# Patient Record
Sex: Female | Born: 1970 | Race: Black or African American | Hispanic: No | Marital: Married | Smoking: Never smoker
Health system: Southern US, Community
[De-identification: ages and names within clinical notes are randomized; demographics above are authoritative.]

## PROBLEM LIST (undated history)

## (undated) ENCOUNTER — Ambulatory Visit: Admission: EM

## (undated) DIAGNOSIS — E119 Type 2 diabetes mellitus without complications: Secondary | ICD-10-CM

## (undated) DIAGNOSIS — G43909 Migraine, unspecified, not intractable, without status migrainosus: Secondary | ICD-10-CM

## (undated) DIAGNOSIS — E7800 Pure hypercholesterolemia, unspecified: Secondary | ICD-10-CM

## (undated) HISTORY — PX: TUBAL LIGATION: SHX77

## (undated) HISTORY — PX: BREAST EXCISIONAL BIOPSY: SUR124

---

## 1998-09-16 ENCOUNTER — Other Ambulatory Visit: Admission: RE | Admit: 1998-09-16 | Discharge: 1998-09-16 | Payer: Self-pay | Admitting: *Deleted

## 1998-10-25 ENCOUNTER — Ambulatory Visit (HOSPITAL_COMMUNITY): Admission: RE | Admit: 1998-10-25 | Discharge: 1998-10-25 | Payer: Self-pay | Admitting: Surgery

## 1998-10-25 ENCOUNTER — Encounter (INDEPENDENT_AMBULATORY_CARE_PROVIDER_SITE_OTHER): Payer: Self-pay | Admitting: Specialist

## 2000-01-21 ENCOUNTER — Other Ambulatory Visit: Admission: RE | Admit: 2000-01-21 | Discharge: 2000-01-21 | Payer: Self-pay | Admitting: *Deleted

## 2000-09-16 ENCOUNTER — Other Ambulatory Visit: Admission: RE | Admit: 2000-09-16 | Discharge: 2000-09-16 | Payer: Self-pay | Admitting: *Deleted

## 2002-03-27 ENCOUNTER — Other Ambulatory Visit: Admission: RE | Admit: 2002-03-27 | Discharge: 2002-03-27 | Payer: Self-pay | Admitting: *Deleted

## 2002-05-11 HISTORY — PX: BREAST BIOPSY: SHX20

## 2003-09-17 ENCOUNTER — Other Ambulatory Visit: Admission: RE | Admit: 2003-09-17 | Discharge: 2003-09-17 | Payer: Self-pay | Admitting: Family Medicine

## 2004-12-16 ENCOUNTER — Other Ambulatory Visit: Admission: RE | Admit: 2004-12-16 | Discharge: 2004-12-16 | Payer: Self-pay | Admitting: Family Medicine

## 2004-12-22 ENCOUNTER — Encounter: Admission: RE | Admit: 2004-12-22 | Discharge: 2004-12-22 | Payer: Self-pay | Admitting: Family Medicine

## 2005-07-23 ENCOUNTER — Encounter: Admission: RE | Admit: 2005-07-23 | Discharge: 2005-07-23 | Payer: Self-pay | Admitting: Family Medicine

## 2005-12-30 ENCOUNTER — Other Ambulatory Visit: Admission: RE | Admit: 2005-12-30 | Discharge: 2005-12-30 | Payer: Self-pay | Admitting: Family Medicine

## 2006-06-10 ENCOUNTER — Encounter: Admission: RE | Admit: 2006-06-10 | Discharge: 2006-06-10 | Payer: Self-pay | Admitting: Allergy and Immunology

## 2006-09-28 ENCOUNTER — Other Ambulatory Visit: Admission: RE | Admit: 2006-09-28 | Discharge: 2006-09-28 | Payer: Self-pay | Admitting: Obstetrics and Gynecology

## 2008-04-11 ENCOUNTER — Other Ambulatory Visit: Admission: RE | Admit: 2008-04-11 | Discharge: 2008-04-11 | Payer: Self-pay | Admitting: Family Medicine

## 2008-05-14 ENCOUNTER — Encounter: Admission: RE | Admit: 2008-05-14 | Discharge: 2008-05-14 | Payer: Self-pay | Admitting: Family Medicine

## 2009-04-17 ENCOUNTER — Other Ambulatory Visit: Admission: RE | Admit: 2009-04-17 | Discharge: 2009-04-17 | Payer: Self-pay | Admitting: Family Medicine

## 2009-04-22 ENCOUNTER — Encounter: Admission: RE | Admit: 2009-04-22 | Discharge: 2009-04-22 | Payer: Self-pay | Admitting: Family Medicine

## 2011-10-22 ENCOUNTER — Other Ambulatory Visit: Payer: Self-pay | Admitting: Family Medicine

## 2011-10-22 ENCOUNTER — Other Ambulatory Visit: Payer: Self-pay | Admitting: Obstetrics and Gynecology

## 2011-10-22 DIAGNOSIS — Z1231 Encounter for screening mammogram for malignant neoplasm of breast: Secondary | ICD-10-CM

## 2011-10-26 ENCOUNTER — Ambulatory Visit: Payer: Self-pay

## 2011-10-29 ENCOUNTER — Ambulatory Visit
Admission: RE | Admit: 2011-10-29 | Discharge: 2011-10-29 | Disposition: A | Discharge status: Home / Self Care | Payer: Federal, State, Local not specified - PPO | Source: Ambulatory Visit | Attending: Obstetrics and Gynecology | Admitting: Obstetrics and Gynecology

## 2011-10-29 DIAGNOSIS — Z1231 Encounter for screening mammogram for malignant neoplasm of breast: Secondary | ICD-10-CM

## 2011-11-16 ENCOUNTER — Other Ambulatory Visit: Payer: Self-pay | Admitting: Obstetrics and Gynecology

## 2011-11-16 ENCOUNTER — Other Ambulatory Visit (HOSPITAL_COMMUNITY)
Admission: RE | Admit: 2011-11-16 | Discharge: 2011-11-16 | Disposition: A | Discharge status: Home / Self Care | Payer: Federal, State, Local not specified - PPO | Source: Ambulatory Visit | Attending: Obstetrics and Gynecology | Admitting: Obstetrics and Gynecology

## 2011-11-16 DIAGNOSIS — Z01419 Encounter for gynecological examination (general) (routine) without abnormal findings: Secondary | ICD-10-CM | POA: Insufficient documentation

## 2012-07-03 ENCOUNTER — Encounter (HOSPITAL_COMMUNITY): Payer: Self-pay | Admitting: Emergency Medicine

## 2012-07-03 ENCOUNTER — Emergency Department (HOSPITAL_COMMUNITY)
Admission: EM | Admit: 2012-07-03 | Discharge: 2012-07-03 | Disposition: A | Discharge status: Home / Self Care | Payer: Federal, State, Local not specified - PPO | Attending: Emergency Medicine | Admitting: Emergency Medicine

## 2012-07-03 ENCOUNTER — Emergency Department (HOSPITAL_COMMUNITY): Payer: Federal, State, Local not specified - PPO

## 2012-07-03 DIAGNOSIS — R109 Unspecified abdominal pain: Secondary | ICD-10-CM

## 2012-07-03 DIAGNOSIS — Z79899 Other long term (current) drug therapy: Secondary | ICD-10-CM | POA: Insufficient documentation

## 2012-07-03 DIAGNOSIS — R112 Nausea with vomiting, unspecified: Secondary | ICD-10-CM | POA: Insufficient documentation

## 2012-07-03 DIAGNOSIS — Z9851 Tubal ligation status: Secondary | ICD-10-CM | POA: Insufficient documentation

## 2012-07-03 DIAGNOSIS — E119 Type 2 diabetes mellitus without complications: Secondary | ICD-10-CM | POA: Insufficient documentation

## 2012-07-03 DIAGNOSIS — N898 Other specified noninflammatory disorders of vagina: Secondary | ICD-10-CM

## 2012-07-03 DIAGNOSIS — Z3202 Encounter for pregnancy test, result negative: Secondary | ICD-10-CM | POA: Insufficient documentation

## 2012-07-03 HISTORY — DX: Type 2 diabetes mellitus without complications: E11.9

## 2012-07-03 LAB — POCT I-STAT, CHEM 8
BUN: 12 mg/dL (ref 6–23)
Calcium, Ion: 1.13 mmol/L (ref 1.12–1.23)
Chloride: 105 mEq/L (ref 96–112)
Creatinine, Ser: 0.6 mg/dL (ref 0.50–1.10)
Glucose, Bld: 159 mg/dL — ABNORMAL HIGH (ref 70–99)
HCT: 35 % — ABNORMAL LOW (ref 36.0–46.0)
Hemoglobin: 11.9 g/dL — ABNORMAL LOW (ref 12.0–15.0)
Potassium: 3.2 mEq/L — ABNORMAL LOW (ref 3.5–5.1)
Sodium: 142 mEq/L (ref 135–145)
TCO2: 25 mmol/L (ref 0–100)

## 2012-07-03 LAB — CBC WITH DIFFERENTIAL/PLATELET
Basophils Absolute: 0 10*3/uL (ref 0.0–0.1)
Basophils Relative: 0 % (ref 0–1)
Eosinophils Absolute: 0.2 10*3/uL (ref 0.0–0.7)
Eosinophils Relative: 3 % (ref 0–5)
HCT: 32.4 % — ABNORMAL LOW (ref 36.0–46.0)
Hemoglobin: 10.2 g/dL — ABNORMAL LOW (ref 12.0–15.0)
Lymphocytes Relative: 37 % (ref 12–46)
Lymphs Abs: 2.8 10*3/uL (ref 0.7–4.0)
MCH: 22.8 pg — ABNORMAL LOW (ref 26.0–34.0)
MCHC: 31.5 g/dL (ref 30.0–36.0)
MCV: 72.3 fL — ABNORMAL LOW (ref 78.0–100.0)
Monocytes Absolute: 0.4 10*3/uL (ref 0.1–1.0)
Monocytes Relative: 6 % (ref 3–12)
Neutro Abs: 4.2 10*3/uL (ref 1.7–7.7)
Neutrophils Relative %: 55 % (ref 43–77)
Platelets: 246 10*3/uL (ref 150–400)
RBC: 4.48 MIL/uL (ref 3.87–5.11)
RDW: 17.5 % — ABNORMAL HIGH (ref 11.5–15.5)
WBC: 7.7 10*3/uL (ref 4.0–10.5)

## 2012-07-03 LAB — URINALYSIS, ROUTINE W REFLEX MICROSCOPIC
Bilirubin Urine: NEGATIVE
Glucose, UA: NEGATIVE mg/dL
Ketones, ur: NEGATIVE mg/dL
Leukocytes, UA: NEGATIVE
Nitrite: NEGATIVE
Protein, ur: NEGATIVE mg/dL
Specific Gravity, Urine: 1.024 (ref 1.005–1.030)
Urobilinogen, UA: 1 mg/dL (ref 0.0–1.0)
pH: 6.5 (ref 5.0–8.0)

## 2012-07-03 LAB — POCT PREGNANCY, URINE: Preg Test, Ur: NEGATIVE

## 2012-07-03 LAB — BASIC METABOLIC PANEL WITH GFR
BUN: 12 mg/dL (ref 6–23)
CO2: 25 meq/L (ref 19–32)
Calcium: 8.8 mg/dL (ref 8.4–10.5)
Chloride: 101 meq/L (ref 96–112)
Creatinine, Ser: 0.63 mg/dL (ref 0.50–1.10)
GFR calc Af Amer: >90 mL/min (ref >90)
GFR calc non Af Amer: >90 mL/min (ref >90)
Glucose, Bld: 156 mg/dL — ABNORMAL HIGH (ref 70–99)
Potassium: 3.2 meq/L — ABNORMAL LOW (ref 3.5–5.1)
Sodium: 137 meq/L (ref 135–145)

## 2012-07-03 LAB — WET PREP, GENITAL
Clue Cells Wet Prep HPF POC: NONE SEEN
Trich, Wet Prep: NONE SEEN
WBC, Wet Prep HPF POC: NONE SEEN
Yeast Wet Prep HPF POC: NONE SEEN

## 2012-07-03 LAB — URINE MICROSCOPIC-ADD ON

## 2012-07-03 MED ORDER — OXYCODONE-ACETAMINOPHEN 5-325 MG PO TABS: 1.0000 | ORAL_TABLET | ORAL | Status: DC | PRN

## 2012-07-03 MED ORDER — ONDANSETRON HCL 4 MG PO TABS: 4.0000 mg | ORAL_TABLET | Freq: Four times a day (QID) | ORAL | Status: DC

## 2012-07-03 MED ORDER — ONDANSETRON 4 MG PO TBDP
4.0000 mg | ORAL_TABLET | Freq: Once | ORAL | Status: AC
  Administered 2012-07-03: 4 mg via ORAL
  Filled 2012-07-03: qty 1

## 2012-07-03 MED ORDER — OXYCODONE-ACETAMINOPHEN 5-325 MG PO TABS
1.0000 | ORAL_TABLET | Freq: Once | ORAL | Status: AC
  Administered 2012-07-03: 1 via ORAL
  Filled 2012-07-03: qty 1

## 2012-07-03 MED ORDER — POTASSIUM CHLORIDE CRYS ER 20 MEQ PO TBCR
40.0000 meq | EXTENDED_RELEASE_TABLET | Freq: Once | ORAL | Status: AC
  Administered 2012-07-03: 40 meq via ORAL
  Filled 2012-07-03: qty 2

## 2012-07-03 NOTE — ED Notes (Signed)
Pt states that she had lower abdominal pain that started about 7pm this evening; pt describes as it felt like she was having a baby; pt states originally the pain started in her back and then came down to her lower abdomen; pt denies pain upon urination; pt states she is currently mensurating and has noticed brown vaginal d/c starting yesterday. Pt alert and mentating appropriately. Pt does not show signs of acute distress currently.

## 2012-07-03 NOTE — ED Notes (Signed)
Patient with severe abdominal pain, started 20 minutes ago while she was a church, constant pain.  Patient with nausea and vomiting, x1.

## 2012-07-03 NOTE — ED Notes (Addendum)
Pt ambulatory leaving ED; Pt given d/c teaching and prescriptions; Pt given follow up care instructions; pt verbalizes understanding of d/c teaching and has no further questions upon d/c; pt alert and mentating appropriately upon d/c; pt does not show signs of acute distress upon d/c. Pt instructed not to drive; pts husband endorses pt will not be driving home.

## 2012-07-03 NOTE — ED Provider Notes (Signed)
History     CSN: [id number]  Arrival date & time 07/03/12  1941   First MD Initiated Contact with Patient 07/03/12 2035      Chief Complaint  Patient presents with  . Abdominal Pain    (Consider location/radiation/quality/duration/timing/severity/associated sxs/prior treatment) Patient is a 42 y.o. female presenting with abdominal pain. The history is provided by the patient and the spouse. No language interpreter was used.  Abdominal Pain Pain location:  L flank Pain quality: sharp   Pain radiates to:  LUQ Pain severity:  Moderate Onset quality:  Sudden Duration:  1 hour Timing:  Constant Progression:  Resolved Chronicity:  New Context: not previous surgeries and not suspicious food intake   Relieved by:  Nothing Worsened by:  Movement Ineffective treatments:  Acetaminophen Associated symptoms: nausea, vaginal bleeding, vaginal discharge and vomiting   Associated symptoms: no chest pain, no chills, no constipation, no diarrhea, no dysuria, no fever, no hematuria and no shortness of breath    42 year old female with acute onset of left flank pain radiating to the left upper quadrant when she was at [employer]. The pain was sharp 10 out of 10 and constant x30 minutes. Pain has resolved presently, although she is sore in her left flank presently. Moving made the pain worse. Nothing made the pain better. She took a Tylenol but soon vomited it back up. Also states she has had vaginal discharge x2 days. She started her period today. Last bowel movement was Friday. Past medical history of diabetes and she's had her tubes tied. PCP is Dr. Lupe Carney. Denies chest pain, hematuria, fever.  Past Medical History  Diagnosis Date  . Diabetes mellitus without complication     Past Surgical History  Procedure Laterality Date  . Tubal ligation      No family history on file.  History  Substance Use Topics  . Smoking status: Never Smoker   . Smokeless tobacco: Not on file  .  Alcohol Use: Yes     Comment: socially    OB History   Grav Para Term Preterm Abortions TAB SAB Ect Mult Living                  Review of Systems  Constitutional: Negative.  Negative for fever, chills and diaphoresis.  HENT: Negative.   Eyes: Negative.   Respiratory: Negative.  Negative for shortness of breath.   Cardiovascular: Negative.  Negative for chest pain.  Gastrointestinal: Positive for nausea, vomiting and abdominal pain. Negative for diarrhea, constipation and blood in stool.  Genitourinary: Positive for flank pain, vaginal bleeding and vaginal discharge. Negative for dysuria, frequency, hematuria and difficulty urinating.  Neurological: Negative.  Negative for dizziness and light-headedness.  Psychiatric/Behavioral: Negative.   All other systems reviewed and are negative.    Allergies  Review of patient's allergies indicates no known allergies.  Home Medications  No current outpatient prescriptions on file.  BP 128/82  Pulse 85  Temp(Src) 98.5 F (36.9 C) (Oral)  Resp 16  SpO2 97%  LMP 07/03/2012  Physical Exam  Nursing note and vitals reviewed. Constitutional: She is oriented to person, place, and time. She appears well-developed and well-nourished.  HENT:  Head: Normocephalic and atraumatic.  Eyes: Conjunctivae and EOM are normal. Pupils are equal, round, and reactive to light.  Neck: Normal range of motion. Neck supple.  Cardiovascular: Normal rate.   Pulmonary/Chest: Effort normal and breath sounds normal. No respiratory distress.  Abdominal: Soft. She exhibits distension. There is tenderness. There  is no rebound and no guarding.  Left flank tenderness  Musculoskeletal: Normal range of motion. She exhibits no edema and no tenderness.  Neurological: She is alert and oriented to person, place, and time. She has normal reflexes.  Skin: Skin is warm and dry.  Psychiatric: She has a normal mood and affect.    ED Course  Pelvic exam Date/Time:  07/03/2012 10:59 PM Performed by: Remi Haggard Authorized by: Remi Haggard Consent: Verbal consent obtained. Risks and benefits: risks, benefits and alternatives were discussed Consent given by: patient Patient understanding: patient states understanding of the procedure being performed Patient identity confirmed: verbally with patient, arm band, provided demographic data and hospital-assigned identification number Time out: Immediately prior to procedure a "time out" was called to verify the correct patient, procedure, equipment, support staff and site/side marked as required. Preparation: Patient was prepped and draped in the usual sterile fashion. Local anesthesia used: no Patient sedated: no Patient tolerance: Patient tolerated the procedure well with no immediate complications. Comments: Vaginal bleeding   (including critical care time)  Labs Reviewed  CBC WITH DIFFERENTIAL - Abnormal; Notable for the following:    Hemoglobin 10.2 (*)    HCT 32.4 (*)    MCV 72.3 (*)    MCH 22.8 (*)    RDW 17.5 (*)    All other components within normal limits  BASIC METABOLIC PANEL - Abnormal; Notable for the following:    Potassium 3.2 (*)    Glucose, Bld 156 (*)    All other components within normal limits  URINALYSIS, ROUTINE W REFLEX MICROSCOPIC - Abnormal; Notable for the following:    Hgb urine dipstick LARGE (*)    All other components within normal limits  URINE MICROSCOPIC-ADD ON - Abnormal; Notable for the following:    Squamous Epithelial / LPF FEW (*)    All other components within normal limits  POCT I-STAT, CHEM 8 - Abnormal; Notable for the following:    Potassium 3.2 (*)    Glucose, Bld 159 (*)    Hemoglobin 11.9 (*)    HCT 35.0 (*)    All other components within normal limits  WET PREP, GENITAL  GC/CHLAMYDIA PROBE AMP  POCT PREGNANCY, URINE   No results found.   No diagnosis found.    MDM  L flank/LLQ pain subsided in the ER.  CT abdomen shows no kidney  stone. Possible mesenteritis. Hypokalemia with potassium replacement in the ER.  All other labs unremarkable.  Pelvic exam with vaginal bleeding, other wise normal.  She will follow up with her primary care provider this week or return for worsening symptoms.  rx for zofran and percocet.  She is ready for discharge.  Labs Reviewed  CBC WITH DIFFERENTIAL - Abnormal; Notable for the following:    Hemoglobin 10.2 (*)    HCT 32.4 (*)    MCV 72.3 (*)    MCH 22.8 (*)    RDW 17.5 (*)    All other components within normal limits  BASIC METABOLIC PANEL - Abnormal; Notable for the following:    Potassium 3.2 (*)    Glucose, Bld 156 (*)    All other components within normal limits  URINALYSIS, ROUTINE W REFLEX MICROSCOPIC - Abnormal; Notable for the following:    Hgb urine dipstick LARGE (*)    All other components within normal limits  URINE MICROSCOPIC-ADD ON - Abnormal; Notable for the following:    Squamous Epithelial / LPF FEW (*)    All other components within  normal limits  POCT I-STAT, CHEM 8 - Abnormal; Notable for the following:    Potassium 3.2 (*)    Glucose, Bld 159 (*)    Hemoglobin 11.9 (*)    HCT 35.0 (*)    All other components within normal limits  WET PREP, GENITAL  GC/CHLAMYDIA PROBE AMP  POCT PREGNANCY, URINE          Remi Haggard, NP 07/04/12 1600

## 2012-07-04 LAB — GC/CHLAMYDIA PROBE AMP
CT Probe RNA: NEGATIVE
GC Probe RNA: NEGATIVE

## 2012-07-05 NOTE — ED Provider Notes (Signed)
Medical screening examination/treatment/procedure(s) were performed by non-physician practitioner and as supervising physician I was immediately available for consultation/collaboration.  Panagiotis Oelkers, MD 07/05/12 0013 

## 2012-09-21 ENCOUNTER — Other Ambulatory Visit: Payer: Self-pay

## 2012-09-21 DIAGNOSIS — Z1231 Encounter for screening mammogram for malignant neoplasm of breast: Secondary | ICD-10-CM

## 2012-09-30 ENCOUNTER — Ambulatory Visit
Admission: RE | Admit: 2012-09-30 | Discharge: 2012-09-30 | Disposition: A | Discharge status: Home / Self Care | Payer: Federal, State, Local not specified - PPO | Source: Ambulatory Visit

## 2012-09-30 DIAGNOSIS — Z1231 Encounter for screening mammogram for malignant neoplasm of breast: Secondary | ICD-10-CM

## 2012-11-22 ENCOUNTER — Other Ambulatory Visit (HOSPITAL_COMMUNITY)
Admission: RE | Admit: 2012-11-22 | Discharge: 2012-11-22 | Disposition: A | Discharge status: Home / Self Care | Payer: Federal, State, Local not specified - PPO | Source: Ambulatory Visit | Attending: Obstetrics and Gynecology | Admitting: Obstetrics and Gynecology

## 2012-11-22 ENCOUNTER — Other Ambulatory Visit: Payer: Self-pay | Admitting: Obstetrics and Gynecology

## 2012-11-22 DIAGNOSIS — Z1151 Encounter for screening for human papillomavirus (HPV): Secondary | ICD-10-CM | POA: Insufficient documentation

## 2012-11-22 DIAGNOSIS — Z01419 Encounter for gynecological examination (general) (routine) without abnormal findings: Secondary | ICD-10-CM | POA: Insufficient documentation

## 2013-11-27 ENCOUNTER — Other Ambulatory Visit (HOSPITAL_COMMUNITY)
Admission: RE | Admit: 2013-11-27 | Discharge: 2013-11-27 | Disposition: A | Discharge status: Home / Self Care | Payer: Federal, State, Local not specified - PPO | Source: Ambulatory Visit | Attending: Obstetrics and Gynecology | Admitting: Obstetrics and Gynecology

## 2013-11-27 ENCOUNTER — Other Ambulatory Visit: Payer: Self-pay | Admitting: Obstetrics and Gynecology

## 2013-11-27 DIAGNOSIS — Z01419 Encounter for gynecological examination (general) (routine) without abnormal findings: Secondary | ICD-10-CM | POA: Insufficient documentation

## 2013-11-27 DIAGNOSIS — N644 Mastodynia: Secondary | ICD-10-CM

## 2013-11-29 LAB — CYTOLOGY - PAP

## 2013-12-04 ENCOUNTER — Encounter (INDEPENDENT_AMBULATORY_CARE_PROVIDER_SITE_OTHER): Payer: Self-pay

## 2013-12-04 ENCOUNTER — Ambulatory Visit
Admission: RE | Admit: 2013-12-04 | Discharge: 2013-12-04 | Disposition: A | Discharge status: Home / Self Care | Payer: Federal, State, Local not specified - PPO | Source: Ambulatory Visit | Attending: Obstetrics and Gynecology | Admitting: Obstetrics and Gynecology

## 2013-12-04 DIAGNOSIS — N644 Mastodynia: Secondary | ICD-10-CM

## 2013-12-13 ENCOUNTER — Other Ambulatory Visit: Payer: Self-pay | Admitting: Obstetrics and Gynecology

## 2014-09-10 ENCOUNTER — Ambulatory Visit
Admission: RE | Admit: 2014-09-10 | Discharge: 2014-09-10 | Disposition: A | Discharge status: Home / Self Care | Payer: Federal, State, Local not specified - PPO | Source: Ambulatory Visit | Attending: Physician Assistant | Admitting: Physician Assistant

## 2014-09-10 ENCOUNTER — Other Ambulatory Visit: Payer: Self-pay | Admitting: Physician Assistant

## 2014-09-10 DIAGNOSIS — R0602 Shortness of breath: Secondary | ICD-10-CM

## 2014-11-20 ENCOUNTER — Other Ambulatory Visit: Payer: Self-pay

## 2014-11-20 DIAGNOSIS — Z9289 Personal history of other medical treatment: Secondary | ICD-10-CM

## 2014-11-22 ENCOUNTER — Emergency Department
Admission: EM | Admit: 2014-11-22 | Discharge: 2014-11-22 | Disposition: A | Discharge status: Home / Self Care | Payer: Federal, State, Local not specified - PPO | Attending: Emergency Medicine | Admitting: Emergency Medicine

## 2014-11-22 ENCOUNTER — Emergency Department: Payer: Federal, State, Local not specified - PPO

## 2014-11-22 ENCOUNTER — Encounter: Payer: Self-pay | Admitting: Urgent Care

## 2014-11-22 DIAGNOSIS — E119 Type 2 diabetes mellitus without complications: Secondary | ICD-10-CM | POA: Insufficient documentation

## 2014-11-22 DIAGNOSIS — G43909 Migraine, unspecified, not intractable, without status migrainosus: Secondary | ICD-10-CM

## 2014-11-22 DIAGNOSIS — Z79899 Other long term (current) drug therapy: Secondary | ICD-10-CM | POA: Diagnosis not present

## 2014-11-22 DIAGNOSIS — Z7982 Long term (current) use of aspirin: Secondary | ICD-10-CM | POA: Insufficient documentation

## 2014-11-22 DIAGNOSIS — R200 Anesthesia of skin: Secondary | ICD-10-CM | POA: Diagnosis present

## 2014-11-22 HISTORY — DX: Pure hypercholesterolemia, unspecified: E78.00

## 2014-11-22 HISTORY — DX: Migraine, unspecified, not intractable, without status migrainosus: G43.909

## 2014-11-22 LAB — COMPREHENSIVE METABOLIC PANEL
ALT: 14 U/L (ref 14–54)
AST: 18 U/L (ref 15–41)
Albumin: 4 g/dL (ref 3.5–5.0)
Alkaline Phosphatase: 71 U/L (ref 38–126)
Anion gap: 10 (ref 5–15)
BUN: 14 mg/dL (ref 6–20)
CO2: 28 mmol/L (ref 22–32)
Calcium: 9.1 mg/dL (ref 8.9–10.3)
Chloride: 102 mmol/L (ref 101–111)
Creatinine, Ser: 0.78 mg/dL (ref 0.44–1.00)
GFR calc Af Amer: >60 mL/min (ref >60)
GFR calc non Af Amer: >60 mL/min (ref >60)
Glucose, Bld: 161 mg/dL — ABNORMAL HIGH (ref 65–99)
Potassium: 3.6 mmol/L (ref 3.5–5.1)
Sodium: 140 mmol/L (ref 135–145)
Total Bilirubin: 0.8 mg/dL (ref 0.3–1.2)
Total Protein: 8 g/dL (ref 6.5–8.1)

## 2014-11-22 LAB — DIFFERENTIAL
Basophils Absolute: 0 10*3/uL (ref 0–0.1)
Basophils Relative: 1 %
Eosinophils Absolute: 0.1 10*3/uL (ref 0–0.7)
Eosinophils Relative: 2 %
Lymphocytes Relative: 39 %
Lymphs Abs: 2 10*3/uL (ref 1.0–3.6)
Monocytes Absolute: 0.3 10*3/uL (ref 0.2–0.9)
Monocytes Relative: 6 %
Neutro Abs: 2.7 10*3/uL (ref 1.4–6.5)
Neutrophils Relative %: 52 %

## 2014-11-22 LAB — CBC
HCT: 42.4 % (ref 35.0–47.0)
Hemoglobin: 13.8 g/dL (ref 12.0–16.0)
MCH: 27.8 pg (ref 26.0–34.0)
MCHC: 32.5 g/dL (ref 32.0–36.0)
MCV: 85.7 fL (ref 80.0–100.0)
Platelets: 175 10*3/uL (ref 150–440)
RBC: 4.94 MIL/uL (ref 3.80–5.20)
RDW: 14.8 % — ABNORMAL HIGH (ref 11.5–14.5)
WBC: 5.1 10*3/uL (ref 3.6–11.0)

## 2014-11-22 LAB — PROTIME-INR
INR: 0.86
Prothrombin Time: 11.9 seconds (ref 11.4–15.0)

## 2014-11-22 LAB — APTT: aPTT: 27 seconds (ref 24–36)

## 2014-11-22 LAB — TROPONIN I: Troponin I: <0.03 ng/mL (ref <0.031)

## 2014-11-22 MED ORDER — KETOROLAC TROMETHAMINE 30 MG/ML IJ SOLN
30.0000 mg | Freq: Once | INTRAMUSCULAR | Status: AC
  Administered 2014-11-22: 30 mg via INTRAVENOUS
  Filled 2014-11-22: qty 1

## 2014-11-22 NOTE — ED Notes (Signed)
Patient presents with c/o LEFT side numbness; face, LUE that began when she woke up at 0300. Of note, patient with a non-specific headache and blurred vision yesterday. Denies N/V. No facial droop or pronator drift noted in triage; grossly NI.

## 2014-11-22 NOTE — ED Notes (Signed)
Pt reports waking with numbness in left side of face and left arm, and headache, located back of head and forehead, described as throbbing.  Pt reports taking  ASA.  Pt states numbness gone in arm but still present in face.  Pt NAD at this time.

## 2014-11-22 NOTE — ED Provider Notes (Signed)
[facility] Emergency Department Provider Note  ____________________________________________  Time seen: 5:30 AM  I have reviewed the triage vital signs and the nursing notes.   HISTORY  Chief Complaint Numbness      HPI Lauren Caldwell is a 44 y.o. female presents with a 4 out of 10 throbbing headache consistent with previous migraine headaches. Patient also admits to left sided facial and arm numbness.      Past Medical History  Diagnosis Date  . Diabetes mellitus without complication   . Hypercholesteremia   . Migraine     There are no active problems to display for this patient.   Past Surgical History  Procedure Laterality Date  . Tubal ligation      Current Outpatient Rx  Name  Route  Sig  Dispense  Refill  . aspirin 81 MG tablet   Oral   Take 81 mg by mouth daily.         Marland Kitchen. atorvastatin (LIPITOR) 20 MG tablet   Oral   Take 20 mg by mouth daily.         . dapagliflozin propanediol (FARXIGA) 5 MG TABS tablet   Oral   Take 5 mg by mouth daily.         Marland Kitchen. glimepiride (AMARYL) 4 MG tablet   Oral   Take 4 mg by mouth daily before breakfast.         . metFORMIN (GLUCOPHAGE) 500 MG tablet   Oral   Take 500 mg by mouth 2 (two) times daily with a meal.         . ondansetron (ZOFRAN) 4 MG tablet   Oral   Take 1 tablet (4 mg total) by mouth every 6 (six) hours.   12 tablet   0   . oxyCODONE-acetaminophen (PERCOCET/ROXICET) 5-325 MG per tablet   Oral   Take 1 tablet by mouth every 4 (four) hours as needed for pain.   10 tablet   0     Allergies Review of patient's allergies indicates no known allergies.  History reviewed. No pertinent family history.  Social History History  Substance Use Topics  . Smoking status: Never Smoker   . Smokeless tobacco: Never Used  . Alcohol Use: Yes     Comment: socially    Review of Systems  Constitutional: Negative for fever. Eyes: Negative for visual changes. ENT:  Negative for sore throat. Cardiovascular: Negative for chest pain. Respiratory: Negative for shortness of breath. Gastrointestinal: Negative for abdominal pain, vomiting and diarrhea. Genitourinary: Negative for dysuria. Musculoskeletal: Negative for back pain. Skin: Negative for rash. Neurological: Positive for headaches, and facial numbness.   10-point ROS otherwise negative.  ____________________________________________   PHYSICAL EXAM:  VITAL SIGNS: ED Triage Vitals  Enc Vitals Group     BP 11/22/14 0436 133/81 mmHg     Pulse Rate 11/22/14 0436 80     Resp 11/22/14 0436 18     Temp 11/22/14 0436 98.6 F (37 C)     Temp Source 11/22/14 0436 Oral     SpO2 11/22/14 0436 97 %     Weight 11/22/14 0436 213 lb (96.616 kg)     Height 11/22/14 0436 5\' 11"  (1.803 m)     Head Cir --      Peak Flow --      Pain Score 11/22/14 0436 4     Pain Loc --      Pain Edu? --      Excl. in  GC? --      Constitutional: Alert and oriented. Well appearing and in no distress. Eyes: Conjunctivae are normal. PERRL. Normal extraocular movements. ENT   Head: Normocephalic and atraumatic.   Nose: No congestion/rhinnorhea.   Mouth/Throat: Mucous membranes are moist.   Neck: No stridor. Hematological/Lymphatic/Immunilogical: No cervical lymphadenopathy. Cardiovascular: Normal rate, regular rhythm. Normal and symmetric distal pulses are present in all extremities. No murmurs, rubs, or gallops. Respiratory: Normal respiratory effort without tachypnea nor retractions. Breath sounds are clear and equal bilaterally. No wheezes/rales/rhonchi. Gastrointestinal: Soft and nontender. No distention. There is no CVA tenderness. Genitourinary: deferred Musculoskeletal: Nontender with normal range of motion in all extremities. No joint effusions.  No lower extremity tenderness nor edema. Neurologic:  Normal speech and language. No gross focal neurologic deficits are appreciated. Speech is normal.   Skin:  Skin is warm, dry and intact. No rash noted. Psychiatric: Mood and affect are normal. Speech and behavior are normal. Patient exhibits appropriate insight and judgment.  ____________________________________________    LABS (pertinent positives/negatives)  Labs Reviewed  CBC - Abnormal; Notable for the following:    RDW 14.8 (*)    All other components within normal limits  COMPREHENSIVE METABOLIC PANEL - Abnormal; Notable for the following:    Glucose, Bld 161 (*)    All other components within normal limits  PROTIME-INR  APTT  DIFFERENTIAL  TROPONIN I     ____________________________________________    RADIOLOGY  CT scan of the head: IMPRESSION: Negative head CT with no acute intracranial process identified.   Electronically Signed By: Rise Mu M.D. On: 11/22/2014 05:35    INITIAL IMPRESSION / ASSESSMENT AND PLAN / ED COURSE  Pertinent labs & imaging results that were available during my care of the patient were reviewed by me and considered in my medical decision making (see chart for details).  No focal neurological deficits noted on exam. CT scan of the head negative. As such, kidney migraine likely etiology for the patient's headache and facial numbness.----------------------------------------- 7:02 AM on 11/22/2014 -----------------------------------------  Patient reevaluated stating that her pain was resolved as well as numbness.   ____________________________________________   FINAL CLINICAL IMPRESSION(S) / ED DIAGNOSES  Final diagnoses:  Migraine without status migrainosus, not intractable, unspecified migraine type      Darci Current, MD 11/27/14 [phone]

## 2014-11-22 NOTE — Discharge Instructions (Signed)

## 2014-11-30 ENCOUNTER — Other Ambulatory Visit (HOSPITAL_COMMUNITY)
Admission: RE | Admit: 2014-11-30 | Discharge: 2014-11-30 | Disposition: A | Discharge status: Home / Self Care | Payer: Federal, State, Local not specified - PPO | Source: Ambulatory Visit | Attending: Obstetrics and Gynecology | Admitting: Obstetrics and Gynecology

## 2014-11-30 ENCOUNTER — Other Ambulatory Visit: Payer: Self-pay | Admitting: Obstetrics and Gynecology

## 2014-11-30 DIAGNOSIS — Z1151 Encounter for screening for human papillomavirus (HPV): Secondary | ICD-10-CM | POA: Diagnosis present

## 2014-11-30 DIAGNOSIS — Z01411 Encounter for gynecological examination (general) (routine) with abnormal findings: Secondary | ICD-10-CM | POA: Insufficient documentation

## 2014-12-03 ENCOUNTER — Encounter: Payer: Self-pay | Admitting: Neurology

## 2014-12-03 ENCOUNTER — Ambulatory Visit (INDEPENDENT_AMBULATORY_CARE_PROVIDER_SITE_OTHER): Payer: Federal, State, Local not specified - PPO | Admitting: Neurology

## 2014-12-03 VITALS — BP 112/60 | HR 88 | Resp 20 | Ht 71.0 in | Wt 213.0 lb

## 2014-12-03 DIAGNOSIS — G4733 Obstructive sleep apnea (adult) (pediatric): Secondary | ICD-10-CM

## 2014-12-03 DIAGNOSIS — G43109 Migraine with aura, not intractable, without status migrainosus: Secondary | ICD-10-CM | POA: Diagnosis not present

## 2014-12-03 DIAGNOSIS — G43009 Migraine without aura, not intractable, without status migrainosus: Secondary | ICD-10-CM | POA: Diagnosis not present

## 2014-12-03 DIAGNOSIS — R200 Anesthesia of skin: Secondary | ICD-10-CM

## 2014-12-03 LAB — CYTOLOGY - PAP

## 2014-12-03 NOTE — Patient Instructions (Addendum)
I think the left sided numbness is migraine but since it is a new symptom, we will check MRI of the brain without contrast. [facility]  12/24/14 at 6:45pm ( YOU MUST HAVE A DRIVER ) take valium after arriving at the facility and your name has been called . Please tell the tech that you have taken the valium   Will contact you with results and whether further testing or follow up is needed.

## 2014-12-03 NOTE — Progress Notes (Addendum)
NEUROLOGY CONSULTATION NOTE  Lauren Caldwell MRN: [id number] DOB: [birth year]  Referring provider: Milus Height, PA Primary care provider: Milus Height, PA  Reason for consult:  Migraine with episode of new left sided numbness  HISTORY OF PRESENT ILLNESS: Lauren Caldwell is a 44 year old left-handed woman with migraine, diabetes and hyperlipidemia who presents for migraine.  ED notes, labs and CT head images reviewed.  PCP note, ED note, labs and CT head images reviewed.  As per ED notes, she presented to the ED at [facility] on 11/22/14 for headache.  She has history of migraine but presentation was different.  It was preceded by left sided numbness and tingling of the face and arm.  There was no associated weakness.  She woke up with the numbness.  The headache was typical but less intense (about 5/10).  It was associated with left sided neck pain as well.  She developed nausea.  CT of the head was performed, which was unremarkable.  Labs were unremarkable, including CBC, INR, PTT and CMP.  TSH was 0.64.  She was told that she had a migraine.  The numbness lasted one week.  Her typical migraines: Onset:  Since young adulthood Location:  Unilateral (either side) Quality:  throbbing Intensity:  Usually 10/10 Aura:  no Prodrome:  no Associated symptoms:  Nausea, vomiting, photophobia Duration:  Few hours, depending on when she takes over the counter medication Frequency:  Has not had migraine for several months.  Prior to then, approximately 2-3 times a month. Triggers/exacerbating factors:  none Relieving factors:  Advil Activity:  Needs to sleep  She sporadically will experience sharp shooting pain around the head, lasting a couple of seconds.  Past abortive medication:  none Past preventative medication:  none Other past therapy:  none  Current abortive medication:  Advil Antihypertensive medications:  Losartan Antidepressant medications:  none Anticonvulsant medications:   none Vitamins/Herbal/Supplements:  none Other therapy:  none Other medication:  Lipitor, glimepiride, Farxiga, ASA 81mg ,  She has history of OSA and used to be on CPAP.  She often wakes up with headache  Caffeine:  3 to 4 times a week Alcohol:  occasionally Smoker:  no Diet:  Not strict.  Needs to increase water Exercise:  Twice a week Depression/stress:  This current migraine associated with increased stress (two friends recently passed away) Sleep hygiene:  Usually good. Family history of headache:  no  PAST MEDICAL HISTORY: Past Medical History  Diagnosis Date  . Diabetes mellitus without complication   . Hypercholesteremia   . Migraine     PAST SURGICAL HISTORY: Past Surgical History  Procedure Laterality Date  . Tubal ligation      MEDICATIONS: Current Outpatient Prescriptions on File Prior to Visit  Medication Sig Dispense Refill  . aspirin 81 MG tablet Take 81 mg by mouth daily.    Marland Kitchen atorvastatin (LIPITOR) 20 MG tablet Take 20 mg by mouth daily.    . dapagliflozin propanediol (FARXIGA) 5 MG TABS tablet Take 5 mg by mouth daily.    Marland Kitchen glimepiride (AMARYL) 4 MG tablet Take 4 mg by mouth daily before breakfast.    . metFORMIN (GLUCOPHAGE) 500 MG tablet Take 500 mg by mouth 2 (two) times daily with a meal.    . ondansetron (ZOFRAN) 4 MG tablet Take 1 tablet (4 mg total) by mouth every 6 (six) hours. (Patient not taking: Reported on 12/03/2014) 12 tablet 0  . oxyCODONE-acetaminophen (PERCOCET/ROXICET) 5-325 MG per tablet Take 1 tablet by mouth  every 4 (four) hours as needed for pain. (Patient not taking: Reported on 12/03/2014) 10 tablet 0   No current facility-administered medications on file prior to visit.    ALLERGIES: No Known Allergies  FAMILY HISTORY: Family History  Problem Relation Age of Onset  . Diabetes Mother   . Hypertension Mother   . Diabetes Brother   . Kidney failure    . Diabetes Maternal Grandmother   . Heart failure Maternal Grandfather      SOCIAL HISTORY: History   Social History  . Marital Status: Married    Spouse Name: N/A  . Number of Children: N/A  . Years of Education: N/A   Occupational History  . Not on file.   Social History Main Topics  . Smoking status: Never Smoker   . Smokeless tobacco: Never Used  . Alcohol Use: 0.0 oz/week    0 Standard drinks or equivalent per week     Comment: socially  . Drug Use: No  . Sexual Activity:    Partners: Male    Birth Control/ Protection: None   Other Topics Concern  . Not on file   Social History Narrative    REVIEW OF SYSTEMS: Constitutional: No fevers, chills, or sweats, no generalized fatigue, change in appetite Eyes: No visual changes, double vision, eye pain Ear, nose and throat: No hearing loss, ear pain, nasal congestion, sore throat Cardiovascular: No chest pain, palpitations Respiratory:  No shortness of breath at rest or with exertion, wheezes GastrointestinaI: No nausea, vomiting, diarrhea, abdominal pain, fecal incontinence Genitourinary:  No dysuria, urinary retention or frequency Musculoskeletal:  No neck pain, back pain Integumentary: No rash, pruritus, skin lesions Neurological: as above Psychiatric: No depression, insomnia, anxiety Endocrine: No palpitations, fatigue, diaphoresis, mood swings, change in appetite, change in weight, increased thirst Hematologic/Lymphatic:  No anemia, purpura, petechiae. Allergic/Immunologic: no itchy/runny eyes, nasal congestion, recent allergic reactions, rashes  PHYSICAL EXAM: Filed Vitals:   12/03/14 1046  BP: 112/60  Pulse: 88  Resp: 20   General: No acute distress.  Patient appears well-groomed.  Head:  Normocephalic/atraumatic Eyes:  fundi unremarkable, without vessel changes, exudates, hemorrhages or papilledema. Neck: supple, no paraspinal tenderness, full range of motion Back: No paraspinal tenderness Heart: regular rate and rhythm Lungs: Clear to auscultation bilaterally. Vascular:  No carotid bruits. Neurological Exam: Mental status: alert and oriented to person, place, and time, recent and remote memory intact, fund of knowledge intact, attention and concentration intact, speech fluent and not dysarthric, language intact. Cranial nerves: CN I: not tested CN II: pupils equal, round and reactive to light, visual fields intact, fundi unremarkable, without vessel changes, exudates, hemorrhages or papilledema. CN III, IV, VI:  full range of motion, no nystagmus, no ptosis CN V: facial sensation intact CN VII: upper and lower face symmetric CN VIII: hearing intact CN IX, X: gag intact, uvula midline CN XI: sternocleidomastoid and trapezius muscles intact CN XII: tongue midline Bulk & Tone: normal, no fasciculations. Motor:  5/5 throughout Sensation:  Temperature and vibration intac Deep Tendon Reflexes:  2+ throughout, toes downgoing Finger to nose testing:  intact Heel to shin:  intact Gait:  Normal station and stride.  Able to turn and tandem walk. Romberg negative.  IMPRESSION: Migraine with new aura. History of migraines without aura OSA  PLAN: 1.  Likely a new aura associated with migraine.  However, since it was a new focal symptom, will get MRI of brain without contrast 2.  Follow up pending results of MRI 3.  May need to have OSA re-evaluated.  Thank you for allowing me to take part in the care of this patient.  [name], DO  CC:  Milus Height, PA

## 2014-12-24 ENCOUNTER — Ambulatory Visit (HOSPITAL_COMMUNITY)
Admission: RE | Admit: 2014-12-24 | Discharge: 2014-12-24 | Disposition: A | Discharge status: Home / Self Care | Payer: Federal, State, Local not specified - PPO | Source: Ambulatory Visit | Attending: Neurology | Admitting: Neurology

## 2014-12-24 ENCOUNTER — Ambulatory Visit: Payer: Federal, State, Local not specified - PPO

## 2014-12-24 DIAGNOSIS — G43009 Migraine without aura, not intractable, without status migrainosus: Secondary | ICD-10-CM | POA: Diagnosis present

## 2014-12-24 DIAGNOSIS — R200 Anesthesia of skin: Secondary | ICD-10-CM | POA: Diagnosis present

## 2014-12-24 DIAGNOSIS — G43109 Migraine with aura, not intractable, without status migrainosus: Secondary | ICD-10-CM

## 2014-12-26 ENCOUNTER — Telehealth: Payer: Self-pay | Admitting: *Deleted

## 2014-12-26 NOTE — Telephone Encounter (Signed)
MRI of brain shows nothing concerning, such as stroke or tumor. There is nothing to explain her symptoms of numbness. Therefore, I still believe thatt the numbness was a symptom of migraine. Patient is aware

## 2014-12-26 NOTE — Telephone Encounter (Signed)
-----   Message from Drema Dallas, DO sent at 12/25/2014  7:22 AM EDT ----- MRI of brain shows nothing concerning, such as stroke or tumor.  There is nothing to explain her symptoms of numbness.  Therefore, I still believe thatt the numbness was a symptom of migraine.

## 2015-03-18 ENCOUNTER — Other Ambulatory Visit: Payer: Self-pay

## 2015-03-18 DIAGNOSIS — Z1231 Encounter for screening mammogram for malignant neoplasm of breast: Secondary | ICD-10-CM

## 2015-04-03 ENCOUNTER — Ambulatory Visit
Admission: RE | Admit: 2015-04-03 | Discharge: 2015-04-03 | Disposition: A | Discharge status: Home / Self Care | Payer: Federal, State, Local not specified - PPO | Source: Ambulatory Visit

## 2015-04-03 DIAGNOSIS — Z1231 Encounter for screening mammogram for malignant neoplasm of breast: Secondary | ICD-10-CM

## 2015-04-18 ENCOUNTER — Other Ambulatory Visit: Payer: Self-pay | Admitting: Nurse Practitioner

## 2015-09-28 DIAGNOSIS — R05 Cough: Secondary | ICD-10-CM | POA: Diagnosis not present

## 2015-09-28 DIAGNOSIS — J019 Acute sinusitis, unspecified: Secondary | ICD-10-CM | POA: Diagnosis not present

## 2015-10-14 DIAGNOSIS — Z0000 Encounter for general adult medical examination without abnormal findings: Secondary | ICD-10-CM | POA: Diagnosis not present

## 2015-10-14 DIAGNOSIS — R809 Proteinuria, unspecified: Secondary | ICD-10-CM | POA: Diagnosis not present

## 2015-10-14 DIAGNOSIS — E7800 Pure hypercholesterolemia, unspecified: Secondary | ICD-10-CM | POA: Diagnosis not present

## 2015-10-14 DIAGNOSIS — E1322 Other specified diabetes mellitus with diabetic chronic kidney disease: Secondary | ICD-10-CM | POA: Diagnosis not present

## 2015-10-15 ENCOUNTER — Ambulatory Visit (INDEPENDENT_AMBULATORY_CARE_PROVIDER_SITE_OTHER): Payer: Federal, State, Local not specified - PPO | Admitting: Family Medicine

## 2015-10-15 ENCOUNTER — Encounter: Payer: Self-pay | Admitting: *Deleted

## 2015-10-15 VITALS — BP 120/86 | HR 66 | Temp 97.8°F | Resp 17 | Ht 71.0 in | Wt 212.0 lb

## 2015-10-15 DIAGNOSIS — E119 Type 2 diabetes mellitus without complications: Secondary | ICD-10-CM

## 2015-10-15 DIAGNOSIS — R5383 Other fatigue: Secondary | ICD-10-CM

## 2015-10-15 DIAGNOSIS — R232 Flushing: Secondary | ICD-10-CM

## 2015-10-15 DIAGNOSIS — R002 Palpitations: Secondary | ICD-10-CM | POA: Diagnosis not present

## 2015-10-15 DIAGNOSIS — Z789 Other specified health status: Secondary | ICD-10-CM | POA: Diagnosis not present

## 2015-10-15 DIAGNOSIS — N926 Irregular menstruation, unspecified: Secondary | ICD-10-CM

## 2015-10-15 DIAGNOSIS — W57XXXA Bitten or stung by nonvenomous insect and other nonvenomous arthropods, initial encounter: Secondary | ICD-10-CM

## 2015-10-15 DIAGNOSIS — T148 Other injury of unspecified body region: Secondary | ICD-10-CM | POA: Diagnosis not present

## 2015-10-15 LAB — COMPLETE METABOLIC PANEL WITH GFR
ALT: 13 U/L (ref 6–29)
AST: 15 U/L (ref 10–30)
Albumin: 4.2 g/dL (ref 3.6–5.1)
Alkaline Phosphatase: 71 U/L (ref 33–115)
BUN: 9 mg/dL (ref 7–25)
CO2: 28 mmol/L (ref 20–31)
Calcium: 9.5 mg/dL (ref 8.6–10.2)
Chloride: 101 mmol/L (ref 98–110)
Creat: 0.7 mg/dL (ref 0.50–1.10)
GFR, Est African American: >89 mL/min (ref >=60)
GFR, Est Non African American: >89 mL/min (ref >=60)
Glucose, Bld: 122 mg/dL — ABNORMAL HIGH (ref 65–99)
Potassium: 4.4 mmol/L (ref 3.5–5.3)
Sodium: 137 mmol/L (ref 135–146)
Total Bilirubin: 0.7 mg/dL (ref 0.2–1.2)
Total Protein: 7.5 g/dL (ref 6.1–8.1)

## 2015-10-15 LAB — POCT CBC
Granulocyte percent: 54.6 %G (ref 37–80)
HCT, POC: 42.1 % (ref 37.7–47.9)
Hemoglobin: 13.9 g/dL (ref 12.2–16.2)
Lymph, poc: 1.7 (ref 0.6–3.4)
MCH, POC: 27.7 pg (ref 27–31.2)
MCHC: 33.1 g/dL (ref 31.8–35.4)
MCV: 83.9 fL (ref 80–97)
MID (cbc): 0.3 (ref 0–0.9)
MPV: 8.7 fL (ref 0–99.8)
POC Granulocyte: 2.3 (ref 2–6.9)
POC LYMPH PERCENT: 39.4 %L (ref 10–50)
POC MID %: 6 %M (ref 0–12)
Platelet Count, POC: 222 10*3/uL (ref 142–424)
RBC: 5.02 M/uL (ref 4.04–5.48)
RDW, POC: 15.5 %
WBC: 4.3 10*3/uL — AB (ref 4.6–10.2)

## 2015-10-15 LAB — GLUCOSE, POCT (MANUAL RESULT ENTRY): POC Glucose: 135 mg/dl — AB (ref 70–99)

## 2015-10-15 LAB — POCT URINALYSIS DIP (MANUAL ENTRY)
Bilirubin, UA: NEGATIVE
Glucose, UA: 500 — AB
Ketones, POC UA: NEGATIVE
Leukocytes, UA: NEGATIVE
Nitrite, UA: NEGATIVE
Protein Ur, POC: NEGATIVE
Spec Grav, UA: 1.01
Urobilinogen, UA: 0.2
pH, UA: 5.5

## 2015-10-15 LAB — POCT URINE PREGNANCY: Preg Test, Ur: NEGATIVE

## 2015-10-15 NOTE — Patient Instructions (Addendum)
IF you received an x-ray today, you will receive an invoice from [facility]. Please contact [facility] at [phone] with questions or concerns regarding your invoice.   IF you received labwork today, you will receive an invoice from [facility]. Please contact [facility] at [phone] with questions or concerns regarding your invoice.   Our billing staff will not be able to assist you with questions regarding bills from these companies.  You will be contacted with the lab results as soon as they are available. The fastest way to get your results is to activate your My Chart account. Instructions are located on the last page of this paperwork. If you have not heard from us regarding the results in 2 weeks, please contact this office.     We recommend that you schedule a mammogram for breast cancer screening. Typically, you do not need a referral to do this. Please contact a local imaging center to schedule your mammogram.  [facility] - [phone](336) [phone]  *ask for the Radiology Department [facility] - [phone] or [phone]  [facility] - [phone] [facility] - [phone] [facility] - [phone]  *ask for the Radiology Department [facility] - [phone]  *ask for the Radiology Department [facility] - [phone]) [phone]  *ask for the Mammography Department [facility] - [phone]   Your EKG appears okay,  urine test overall normal, blood sugar only mildly elevated. White blood cell count or infection fighting cells were borderline low, which can sometimes indicate a virus. I will check the malaria testing as we discussed, but for now recommend rest, drink plenty of fluids, and recheck in the next 3 days if you are not improving. Sooner if worse.  Measure temperature if you do feel flushed or feel like you may be  having a fever. And if you are having fevers, return for recheck.   Return to the clinic or go to the nearest emergency room if any of your symptoms worsen or new symptoms occur.  Fatigue Fatigue is feeling tired all of the time, a lack of energy, or a lack of motivation. Occasional or mild fatigue is often a normal response to activity or life in general. However, long-lasting (chronic) or extreme fatigue may indicate an underlying medical condition. HOME CARE INSTRUCTIONS  Watch your fatigue for any changes. The following actions may help to lessen any discomfort you are feeling:  Talk to your health care provider about how much sleep you need each night. Try to get the required amount every night.  Take medicines only as directed by your health care provider.  Eat a healthy and nutritious diet. Ask your health care provider if you need help changing your diet.  Drink enough fluid to keep your urine clear or pale yellow.  Practice ways of relaxing, such as yoga, meditation, massage therapy, or acupuncture.  Exercise regularly.   Change situations that cause you stress. Try to keep your work and personal routine reasonable.  Do not abuse illegal drugs.  Limit alcohol intake to no more than 1 drink per day for nonpregnant women and 2 drinks per day for men. One drink equals 12 ounces of beer, 5 ounces of wine, or 1 ounces of hard liquor.  Take a multivitamin, if directed by your health care provider. SEEK MEDICAL CARE IF:   Your fatigue does not get better.  You have a  fever.   You have unintentional weight loss or gain.  You have headaches.   You have difficulty:   Falling asleep.  Sleeping throughout the night.  You feel angry, guilty, anxious, or sad.   You are unable to have a bowel movement (constipation).   You skin is dry.   Your legs or another part of your body is swollen.  SEEK IMMEDIATE MEDICAL CARE IF:   You feel confused.   Your vision  is blurry.  You feel faint or pass out.   You have a severe headache.   You have severe abdominal, pelvic, or back pain.   You have chest pain, shortness of breath, or an irregular or fast heartbeat.   You are unable to urinate or you urinate less than normal.   You develop abnormal bleeding, such as bleeding from the rectum, vagina, nose, lungs, or nipples.  You vomit blood.   You have thoughts about harming yourself or committing suicide.   You are worried that you might harm someone else.    This information is not intended to replace advice given to you by your health care provider. Make sure you discuss any questions you have with your health care provider.   Document Released: 02/22/2007 Document Revised: 05/18/2014 Document Reviewed: 08/29/2013 Elsevier Interactive Patient Education [employer]

## 2015-10-15 NOTE — Progress Notes (Signed)
By signing my name below I, Shelah LewandowskyJoseph Thomas, attest that this documentation has been prepared under the direction and in the presence of Shade FloodJeffrey R Kendrik Mcshan, MD. Electonically Signed. Shelah LewandowskyJoseph Thomas, Scribe 10/15/2015 at 12:01 PM   Subjective:    Patient ID: Lauren Caldwell, female    DOB: Apr 09, 1971, 45 y.o.   MRN: [id number]  Chief Complaint  Patient presents with  . Fatigue    3 day     HPI Lauren Caldwell is a 45 y.o. female who presents [facility] the [facility] complaining of severe fatigue that has been constant for the past 3 days and off and on for the past week. Pt also reports cough, hot spells, and "weird sensation in stomach". Pt reports one episode of mild diarrhea yesterday. Pt denies any N/V, fever, dysuria, urinary frequency, or blood in stool or blood in diarrhea. Cough has been going on for the past 3 weeks.  Pt was seen at an urgent care in [place] and treated with doxycycline and tessalon for cough on 09/28/15. Pt recently got back from [place] the [place] (10/03/[place]). Pt was on doxycycline while she was in the [place]. Pt discontinued taking the [place] and did not finish the full course of her antibiotics. Pt was in the [place] for 2 days after she stopped taking the doxycycline and had a few mosquito bites at that time.   Pt also has been having episodes heart palpitations for the past 3 days. Episodes last for a few seconds at a time. Pt thinks episodes are due to increased anxiety.   Pt was seen and evaluated yesterday by her PCP for her annual exam and had blood-work including A1C drawn. Pt mentioned the cough to her PCP who thought it was due to pt's seasonal allergies. Pt did not mention the fatigue or chills.  Denies any known tick bites.  No period since March, which has been pt's normal menstrual cycle for the past 3 years. Pt has history of tubal ligation.  History of DM Pt reports that her lowest  blood sugar over the past few weeks has been 75. Pt reports not knowing her A1C that was drawn yesterday and her last A1C was 6.2 in March.  Pt also has a history of HLD and OSA.  Pt denies history of thyroid issues.      Patient Active Problem List   Diagnosis Date Noted  . Migraine without aura and without status migrainosus, not intractable 12/03/2014  . OSA (obstructive sleep apnea) 12/03/2014   Past Medical History  Diagnosis Date  . Diabetes mellitus without complication (HCC)   . Hypercholesteremia   . Migraine    Past Surgical History  Procedure Laterality Date  . Tubal ligation     No Known Allergies Prior to Admission medications   Medication Sig Start Date End Date Taking? Authorizing Provider  atorvastatin (LIPITOR) 20 MG tablet Take 20 mg by mouth daily.   Yes Historical Provider, MD  benzonatate (TESSALON) 100 MG capsule  08/29/14  Yes Historical Provider, MD  dapagliflozin propanediol (FARXIGA) 5 MG TABS tablet Take 5 mg by mouth daily.   Yes Historical Provider, MD  glimepiride (AMARYL) 4 MG tablet Take 4 mg by mouth daily before breakfast.   Yes Historical Provider, MD  losartan (COZAAR) 25 MG tablet  11/03/14  Yes Historical Provider, MD  metFORMIN (GLUCOPHAGE) 500 MG tablet Take 500 mg by mouth 2 (two) times daily with a  meal.   Yes Historical Provider, MD  ondansetron (ZOFRAN) 4 MG tablet Take 1 tablet (4 mg total) by mouth every 6 (six) hours. 07/03/12  Yes Jethro Bastos, NP  oxyCODONE-acetaminophen (PERCOCET/ROXICET) 5-325 MG per tablet Take 1 tablet by mouth every 4 (four) hours as needed for pain. 07/03/12  Yes Jethro Bastos, NP  PROAIR HFA 108 [phone] BASE) MCG/ACT inhaler  08/29/14  Yes Historical Provider, MD  aspirin 81 MG tablet Take 81 mg by mouth daily. Reported on 10/15/2015    Historical Provider, MD   Social History   Social History  . Marital Status: Married    Spouse Name: N/A  . Number of Children: N/A  . Years of Education: N/A    Occupational History  . Not on file.   Social History Main Topics  . Smoking status: Never Smoker   . Smokeless tobacco: Never Used  . Alcohol Use: 0.0 oz/week    0 Standard drinks or equivalent per week     Comment: socially  . Drug Use: No  . Sexual Activity:    Partners: Male    Birth Control/ Protection: None   Other Topics Concern  . Not on file   Social History Narrative      Review of Systems  Constitutional: Positive for fatigue. Negative for unexpected weight change.  Respiratory: Positive for cough. Negative for chest tightness and shortness of breath.   Cardiovascular: Positive for palpitations. Negative for chest pain and leg swelling.  Gastrointestinal: Positive for diarrhea. Negative for nausea, vomiting, abdominal pain and blood in stool.  Neurological: Negative for dizziness, syncope, light-headedness and headaches.       Objective:   Physical Exam  Constitutional: She is oriented to person, place, and time. She appears well-developed and well-nourished. No distress.  HENT:  Head: Normocephalic and atraumatic.  Right Ear: Hearing, tympanic membrane, external ear and ear canal normal.  Left Ear: Hearing, tympanic membrane, external ear and ear canal normal.  Nose: Nose normal.  Mouth/Throat: Oropharynx is clear and moist. No oropharyngeal exudate.  Eyes: Conjunctivae and EOM are normal. Pupils are equal, round, and reactive to light.  Neck: Carotid bruit is not present.  Cardiovascular: Normal rate, regular rhythm, normal heart sounds and intact distal pulses.  Exam reveals no gallop and no friction rub.   No murmur heard. Pulmonary/Chest: Effort normal and breath sounds normal. No respiratory distress. She has no decreased breath sounds. She has no wheezes. She has no rhonchi. She has no rales.  Abdominal: Soft. She exhibits no pulsatile midline mass. There is no tenderness.  Lymphadenopathy:    She has no cervical adenopathy.  Neurological: She is  alert and oriented to person, place, and time.  Skin: Skin is warm and dry. No rash noted.  Psychiatric: She has a normal mood and affect. Her behavior is normal.  Vitals reviewed.    Filed Vitals:   10/15/15 0943  BP: 120/86  Pulse: 66  Temp: 97.8 F (36.6 C)  TempSrc: Oral  Resp: 17  Height: 5\' 11"  (1.803 m)  Weight: 212 lb (96.163 kg)  SpO2: 100%    Results for orders placed or performed in visit on 10/15/15  POCT glucose (manual entry)  Result Value Ref Range   POC Glucose 135 (A) 70 - 99 mg/dl  POCT CBC  Result Value Ref Range   WBC 4.3 (A) 4.6 - 10.2 K/uL   Lymph, poc 1.7 0.6 - 3.4   POC LYMPH PERCENT 39.4 10 -  50 %L   MID (cbc) 0.3 0 - 0.9   POC MID % 6.0 0 - 12 %M   POC Granulocyte 2.3 2 - 6.9   Granulocyte percent 54.6 37 - 80 %G   RBC 5.02 4.04 - 5.48 M/uL   Hemoglobin 13.9 12.2 - 16.2 g/dL   HCT, POC 40.9 81.1 - 47.9 %   MCV 83.9 80 - 97 fL   MCH, POC 27.7 27 - 31.2 pg   MCHC 33.1 31.8 - 35.4 g/dL   RDW, POC 91.4 %   Platelet Count, POC 222 142 - 424 K/uL   MPV 8.7 0 - 99.8 fL  POCT urinalysis dipstick  Result Value Ref Range   Color, UA yellow yellow   Clarity, UA clear clear   Glucose, UA =500 (A) negative   Bilirubin, UA negative negative   Ketones, POC UA negative negative   Spec Grav, UA 1.010    Blood, UA trace-intact (A) negative   pH, UA 5.5    Protein Ur, POC negative negative   Urobilinogen, UA 0.2    Nitrite, UA Negative Negative   Leukocytes, UA Negative Negative  POCT urine pregnancy  Result Value Ref Range   Preg Test, Ur Negative Negative     EKG interpreted by Dr Neva Seat. EKG shows sinus rhythm with no acute changes.     Assessment & Plan:   Lauren Caldwell is a 45 y.o. female Other fatigue - Plan: POCT CBC, COMPLETE METABOLIC PANEL WITH GFR, POCT urinalysis dipstick, Malaria Smear  Flushing - Plan: POCT glucose (manual entry), POCT CBC, Malaria Smear  Heart palpitations - Plan: POCT glucose (manual entry), COMPLETE  METABOLIC PANEL WITH GFR, EKG 12-Lead, TSH  Type 2 diabetes mellitus without complication, without long-term current use of insulin (HCC) - Plan: POCT urinalysis dipstick  Irregular menses - Plan: POCT urine pregnancy  Mosquito bite - Plan: Malaria Smear  Hx of foreign travel - Plan: Malaria Smear  Overall reassuring exam, afebrile in the office, EKG reassuring the office, and only intermittent palpitations. Recent foreign travel in area of possible malaria, but had been taking doxycycline the first few days she was there. Will check malaria smear, but less likely. CBC indicates possible viral illness.  -Check TSH, CMP, but if persistent palpitations, recommend evaluation with cardiologist. Sooner or to ER if worsened or with chest pain.  -Increase fluids, rest, symptomatic care for current illness and recheck in the next 3 days if not improving. Sooner if worse.   Patient Instructions       IF you received an x-ray today, you will receive an invoice from [facility]. Please contact [facility] at [phone] with questions or concerns regarding your invoice.   IF you received labwork today, you will receive an invoice from [facility]. Please contact [facility] at [phone] with questions or concerns regarding your invoice.   Our billing staff will not be able to assist you with questions regarding bills from these companies.  You will be contacted with the lab results as soon as they are available. The fastest way to get your results is to activate your My Chart account. Instructions are located on the last page of this paperwork. If you have not heard from [place] regarding the results in 2 weeks, please contact this office.     We recommend that you schedule a mammogram for breast cancer screening. Typically, you do not need a referral to do this. Please contact a local imaging center  to schedule your mammogram.  [facility] -  [phone]  *ask for the Radiology Department [facility]) - [phone] or [phone]  [facility] - [phone] [facility] - [phone] [facility] - [phone]  *ask for the Radiology Department [facility] - [phone]  *ask for the Radiology Department [facility] - [phone]  *ask for the Mammography Department [facility] - [phone]   Your EKG appears okay,  urine test overall normal, blood sugar only mildly elevated. White blood cell count or infection fighting cells were borderline low, which can sometimes indicate a virus. I will check the malaria testing as we discussed, but for now recommend rest, drink plenty of fluids, and recheck in the next 3 days if you are not improving. Sooner if worse.  Measure temperature if you do feel flushed or feel like you may be having a fever. And if you are having fevers, return for recheck.   Return to the clinic or go to the nearest emergency room if any of your symptoms worsen or new symptoms occur.  Fatigue Fatigue is feeling tired all of the time, a lack of energy, or a lack of motivation. Occasional or mild fatigue is often a normal response to activity or life in general. However, long-lasting (chronic) or extreme fatigue may indicate an underlying medical condition. HOME CARE INSTRUCTIONS  Watch your fatigue for any changes. The following actions may help to lessen any discomfort you are feeling:  Talk to your health care provider about how much sleep you need each night. Try to get the required amount every night.  Take medicines only as directed by your health care provider.  Eat a healthy and nutritious diet. Ask your health care provider if you need help changing your diet.  Drink enough fluid to keep your urine clear or pale yellow.  Practice ways of relaxing, such as yoga, meditation, massage therapy, or  acupuncture.  Exercise regularly.   Change situations that cause you stress. Try to keep your work and personal routine reasonable.  Do not abuse illegal drugs.  Limit alcohol intake to no more than 1 drink per day for nonpregnant women and 2 drinks per day for men. One drink equals 12 ounces of beer, 5 ounces of wine, or 1 ounces of hard liquor.  Take a multivitamin, if directed by your health care provider. SEEK MEDICAL CARE IF:   Your fatigue does not get better.  You have a fever.   You have unintentional weight loss or gain.  You have headaches.   You have difficulty:   Falling asleep.  Sleeping throughout the night.  You feel angry, guilty, anxious, or sad.   You are unable to have a bowel movement (constipation).   You skin is dry.   Your legs or another part of your body is swollen.  SEEK IMMEDIATE MEDICAL CARE IF:   You feel confused.   Your vision is blurry.  You feel faint or pass out.   You have a severe headache.   You have severe abdominal, pelvic, or back pain.   You have chest pain, shortness of breath, or an irregular or fast heartbeat.   You are unable to urinate or you urinate less than normal.   You develop abnormal bleeding, such as bleeding from the rectum, vagina, nose, lungs, or nipples.  You vomit blood.   You have thoughts about harming yourself or  committing suicide.   You are worried that you might harm someone else.    This information is not intended to replace advice given to you by your health care provider. Make sure you discuss any questions you have with your health care provider.   Document Released: 02/22/2007 Document Revised: 05/18/2014 Document Reviewed: 08/29/2013 Elsevier Interactive Patient Education [employer]     I personally performed the services described in this documentation, which was scribed in my presence. The recorded information has been reviewed and considered, and  addended by me as needed.   Signed,   Meredith Staggers, MD [facility].  10/15/2015 12:05 PM

## 2015-10-16 LAB — TSH: TSH: 1.64 mIU/L

## 2015-10-18 LAB — MALARIA SMEAR

## 2015-11-04 DIAGNOSIS — K649 Unspecified hemorrhoids: Secondary | ICD-10-CM | POA: Diagnosis not present

## 2015-12-02 ENCOUNTER — Other Ambulatory Visit (HOSPITAL_COMMUNITY)
Admission: RE | Admit: 2015-12-02 | Discharge: 2015-12-02 | Disposition: A | Discharge status: Home / Self Care | Payer: Federal, State, Local not specified - PPO | Source: Ambulatory Visit | Attending: Obstetrics and Gynecology | Admitting: Obstetrics and Gynecology

## 2015-12-02 ENCOUNTER — Other Ambulatory Visit: Payer: Self-pay | Admitting: Obstetrics and Gynecology

## 2015-12-02 DIAGNOSIS — Z1151 Encounter for screening for human papillomavirus (HPV): Secondary | ICD-10-CM | POA: Diagnosis not present

## 2015-12-02 DIAGNOSIS — R87610 Atypical squamous cells of undetermined significance on cytologic smear of cervix (ASC-US): Secondary | ICD-10-CM | POA: Diagnosis not present

## 2015-12-02 DIAGNOSIS — Z01419 Encounter for gynecological examination (general) (routine) without abnormal findings: Secondary | ICD-10-CM | POA: Diagnosis not present

## 2015-12-02 DIAGNOSIS — Z01411 Encounter for gynecological examination (general) (routine) with abnormal findings: Secondary | ICD-10-CM | POA: Diagnosis not present

## 2015-12-03 LAB — CYTOLOGY - PAP

## 2015-12-12 DIAGNOSIS — R05 Cough: Secondary | ICD-10-CM | POA: Diagnosis not present

## 2015-12-20 ENCOUNTER — Ambulatory Visit
Admission: RE | Admit: 2015-12-20 | Discharge: 2015-12-20 | Disposition: A | Discharge status: Home / Self Care | Payer: Federal, State, Local not specified - PPO | Source: Ambulatory Visit | Attending: Physician Assistant | Admitting: Physician Assistant

## 2015-12-20 ENCOUNTER — Other Ambulatory Visit: Payer: Self-pay | Admitting: Physician Assistant

## 2015-12-20 DIAGNOSIS — R059 Cough, unspecified: Secondary | ICD-10-CM

## 2015-12-20 DIAGNOSIS — R05 Cough: Secondary | ICD-10-CM

## 2016-01-08 ENCOUNTER — Encounter: Payer: Federal, State, Local not specified - PPO | Attending: Physician Assistant | Admitting: Dietician

## 2016-01-08 ENCOUNTER — Encounter: Payer: Self-pay | Admitting: Dietician

## 2016-01-08 DIAGNOSIS — Z713 Dietary counseling and surveillance: Secondary | ICD-10-CM | POA: Insufficient documentation

## 2016-01-08 DIAGNOSIS — E7800 Pure hypercholesterolemia, unspecified: Secondary | ICD-10-CM | POA: Diagnosis not present

## 2016-01-08 DIAGNOSIS — E785 Hyperlipidemia, unspecified: Secondary | ICD-10-CM

## 2016-01-08 NOTE — Progress Notes (Signed)
Medical Nutrition Therapy:  Appt start time: 1405 end time:  1510.   Assessment:  Primary concerns today: Lauren Caldwell is here today since she has diabetes and she is trying to get cholesterol under control with exercise. Has been taking Lipitor for a few years. Feels like she doesn't know a lot about why drives up cholesterol. Had been working out but not as much recently. Diabetes is well controlled with Hgb A1c.6.3%. Lost about 30 lbs in the past year (highest weight was 233 lbs and got down to 204 lbs). Lost weight mostly by exercise. Did not cut back a whole lot in what she eats.  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 Lives with her 2 non-verbal autistic foster sons who are 45 years old, husband, and mother-in-law. Has a daughter in college who does not live in home. One son has some medical issues recently. Mother-in-law does the meal preparation and she does the shopping. Had been skipping breakfast but trying to do better and will sometimes skip dinner. Most lunch meals are restaurant meals and breakfast meals are out if she has them.  Was doing high impact intense exercise class 2 x week. Plans to start back on 02/09/2016.  Does not have as much of an appetite as she did before for the past couple of months. Has had congestion/cough since May. Has gotten better and then feels sick again. Has been to the doctor for this but not sure what is going on.    Preferred Learning Style:   No preference indicated   Learning Readiness:   Ready   MEDICATIONS: see list   DIETARY INTAKE:  Usual eating pattern includes 2-3 meals and 1-2 snacks per day.  Avoided foods include doesn't eat a lot of vegetables but does like them, does not like leftovers    24-hr recall:  B ( AM): none or egg white with veggies from cafeteria at work or sausage, grits, eggs, with toast or biscuit on the go  Snk ( AM): none  L ( PM): sweet potato fries, pita delight chicken wrap, [place], PF Change honey chicken and rice, subway tuna  or [place] 6 inch sometimes with chips  Snk ( PM): none or chips or cherries/fruit/crackers D ( PM): salad or spam sandwich or small portions of BBQ and potato salad or baked chicken or pot pie or pizza Snk ( PM): chips or nuts or cake or ice cream  Beverages: water or juice sometimes  Usual physical activity: none recently   Estimated energy needs: 1800 calories 200 g carbohydrates 135 g protein 50 g fat  Progress Towards Goal(s):  In progress.   Nutritional Diagnosis:  NB-1.1 Food and nutrition-related knowledge deficit As related to excess consumption of high fat and high carbohydrate foods.  As evidenced by diet recall and hx of hyperlipidemia.    Intervention:  Nutrition counseling provided. Plan: Aim to eat 3 times per day. Have protein with carbs when you eat a meal/snack. Try cutting Premier protein with unsweetened almond milk for breakfast or snacks. Aim to fill half of your plate with vegetables at lunch and dinner. Try using bagged spinach or frozen for quick options. Choose one starch when you have restaurant meal (about what your can hold in your hand). Choose lean, baked, broiled, or grilled protein instead of fried. Limit packaged sweets (high in sugar and trans fats). Try diluting juice with water or sparkling water.  Start back to exercise.  Teaching Method Utilized:  Visual Auditory Hands on  Handouts given during visit include:  MyPlate Handout  15 g CHO Snacks  Meal Card  Barriers to learning/adherence to lifestyle change: busy schedule  Demonstrated degree of understanding via:  Teach Back   Monitoring/Evaluation:  Dietary intake, exercise, and body weight in 1 month(s).

## 2016-01-08 NOTE — Patient Instructions (Addendum)
Aim to eat 3 times per day. Have protein with carbs when you eat a meal/snack. Try cutting Premier protein with unsweetened almond milk for breakfast or snacks. Aim to fill half of your plate with vegetables at lunch and dinner. Try using bagged spinach or frozen for quick options. Choose one starch when you have restaurant meal (about what your can hold in your hand). Choose lean, baked, broiled, or grilled protein instead of fried. Limit packaged sweets (high in sugar and trans fats). Try diluting juice with water or sparkling water.  Start back to exercise.

## 2016-01-09 ENCOUNTER — Encounter: Payer: Self-pay | Admitting: Dietician

## 2016-02-04 DIAGNOSIS — N926 Irregular menstruation, unspecified: Secondary | ICD-10-CM | POA: Diagnosis not present

## 2016-02-05 DIAGNOSIS — Z23 Encounter for immunization: Secondary | ICD-10-CM | POA: Diagnosis not present

## 2016-02-12 ENCOUNTER — Ambulatory Visit: Payer: Federal, State, Local not specified - PPO | Admitting: Dietician

## 2016-02-13 DIAGNOSIS — R05 Cough: Secondary | ICD-10-CM | POA: Diagnosis not present

## 2016-02-13 DIAGNOSIS — R002 Palpitations: Secondary | ICD-10-CM | POA: Diagnosis not present

## 2016-03-02 DIAGNOSIS — R002 Palpitations: Secondary | ICD-10-CM | POA: Insufficient documentation

## 2016-03-03 ENCOUNTER — Ambulatory Visit (INDEPENDENT_AMBULATORY_CARE_PROVIDER_SITE_OTHER): Payer: Federal, State, Local not specified - PPO

## 2016-03-03 DIAGNOSIS — R002 Palpitations: Secondary | ICD-10-CM

## 2016-04-01 DIAGNOSIS — R350 Frequency of micturition: Secondary | ICD-10-CM | POA: Diagnosis not present

## 2016-04-14 ENCOUNTER — Other Ambulatory Visit: Payer: Self-pay | Admitting: Physician Assistant

## 2016-04-14 DIAGNOSIS — E7800 Pure hypercholesterolemia, unspecified: Secondary | ICD-10-CM | POA: Diagnosis not present

## 2016-04-14 DIAGNOSIS — E1322 Other specified diabetes mellitus with diabetic chronic kidney disease: Secondary | ICD-10-CM | POA: Diagnosis not present

## 2016-04-14 DIAGNOSIS — R809 Proteinuria, unspecified: Secondary | ICD-10-CM | POA: Diagnosis not present

## 2016-04-14 DIAGNOSIS — Z1231 Encounter for screening mammogram for malignant neoplasm of breast: Secondary | ICD-10-CM

## 2016-04-24 ENCOUNTER — Ambulatory Visit
Admission: RE | Admit: 2016-04-24 | Discharge: 2016-04-24 | Disposition: A | Discharge status: Home / Self Care | Payer: Federal, State, Local not specified - PPO | Source: Ambulatory Visit | Attending: Physician Assistant | Admitting: Physician Assistant

## 2016-04-24 DIAGNOSIS — Z1231 Encounter for screening mammogram for malignant neoplasm of breast: Secondary | ICD-10-CM

## 2016-10-20 DIAGNOSIS — E1322 Other specified diabetes mellitus with diabetic chronic kidney disease: Secondary | ICD-10-CM | POA: Diagnosis not present

## 2016-10-20 DIAGNOSIS — Z0000 Encounter for general adult medical examination without abnormal findings: Secondary | ICD-10-CM | POA: Diagnosis not present

## 2016-10-20 DIAGNOSIS — E7800 Pure hypercholesterolemia, unspecified: Secondary | ICD-10-CM | POA: Diagnosis not present

## 2016-10-20 DIAGNOSIS — R809 Proteinuria, unspecified: Secondary | ICD-10-CM | POA: Diagnosis not present

## 2017-01-06 ENCOUNTER — Other Ambulatory Visit (HOSPITAL_COMMUNITY)
Admission: RE | Admit: 2017-01-06 | Discharge: 2017-01-06 | Disposition: A | Discharge status: Home / Self Care | Payer: Federal, State, Local not specified - PPO | Source: Ambulatory Visit | Attending: Obstetrics and Gynecology | Admitting: Obstetrics and Gynecology

## 2017-01-06 ENCOUNTER — Other Ambulatory Visit: Payer: Self-pay | Admitting: Obstetrics and Gynecology

## 2017-01-06 DIAGNOSIS — Z124 Encounter for screening for malignant neoplasm of cervix: Secondary | ICD-10-CM | POA: Insufficient documentation

## 2017-01-06 DIAGNOSIS — Z01419 Encounter for gynecological examination (general) (routine) without abnormal findings: Secondary | ICD-10-CM | POA: Diagnosis not present

## 2017-01-06 DIAGNOSIS — R87810 Cervical high risk human papillomavirus (HPV) DNA test positive: Secondary | ICD-10-CM | POA: Insufficient documentation

## 2017-01-06 DIAGNOSIS — N951 Menopausal and female climacteric states: Secondary | ICD-10-CM | POA: Diagnosis not present

## 2017-01-06 DIAGNOSIS — R6882 Decreased libido: Secondary | ICD-10-CM | POA: Diagnosis not present

## 2017-01-15 LAB — CYTOLOGY - PAP
Diagnosis: NEGATIVE
HPV 16/18/45 genotyping: NEGATIVE
HPV: DETECTED — AB

## 2017-02-17 DIAGNOSIS — R05 Cough: Secondary | ICD-10-CM | POA: Diagnosis not present

## 2017-02-17 DIAGNOSIS — R21 Rash and other nonspecific skin eruption: Secondary | ICD-10-CM | POA: Diagnosis not present

## 2017-02-17 DIAGNOSIS — Z23 Encounter for immunization: Secondary | ICD-10-CM | POA: Diagnosis not present

## 2017-03-04 DIAGNOSIS — J069 Acute upper respiratory infection, unspecified: Secondary | ICD-10-CM | POA: Diagnosis not present

## 2017-04-12 DIAGNOSIS — R6882 Decreased libido: Secondary | ICD-10-CM | POA: Diagnosis not present

## 2017-04-12 DIAGNOSIS — R232 Flushing: Secondary | ICD-10-CM | POA: Diagnosis not present

## 2017-05-13 DIAGNOSIS — R350 Frequency of micturition: Secondary | ICD-10-CM | POA: Diagnosis not present

## 2017-05-24 ENCOUNTER — Other Ambulatory Visit: Payer: Self-pay | Admitting: Physician Assistant

## 2017-05-24 DIAGNOSIS — Z1231 Encounter for screening mammogram for malignant neoplasm of breast: Secondary | ICD-10-CM

## 2017-05-28 DIAGNOSIS — E7800 Pure hypercholesterolemia, unspecified: Secondary | ICD-10-CM | POA: Diagnosis not present

## 2017-05-28 DIAGNOSIS — N644 Mastodynia: Secondary | ICD-10-CM | POA: Diagnosis not present

## 2017-05-28 DIAGNOSIS — E668 Other obesity: Secondary | ICD-10-CM | POA: Diagnosis not present

## 2017-05-28 DIAGNOSIS — E1322 Other specified diabetes mellitus with diabetic chronic kidney disease: Secondary | ICD-10-CM | POA: Diagnosis not present

## 2017-06-29 ENCOUNTER — Ambulatory Visit
Admission: RE | Admit: 2017-06-29 | Discharge: 2017-06-29 | Disposition: A | Discharge status: Home / Self Care | Payer: Federal, State, Local not specified - PPO | Source: Ambulatory Visit | Attending: Physician Assistant | Admitting: Physician Assistant

## 2017-06-29 DIAGNOSIS — Z1231 Encounter for screening mammogram for malignant neoplasm of breast: Secondary | ICD-10-CM | POA: Diagnosis not present

## 2017-10-26 DIAGNOSIS — Z0000 Encounter for general adult medical examination without abnormal findings: Secondary | ICD-10-CM | POA: Diagnosis not present

## 2017-10-26 DIAGNOSIS — R809 Proteinuria, unspecified: Secondary | ICD-10-CM | POA: Diagnosis not present

## 2017-10-26 DIAGNOSIS — E7800 Pure hypercholesterolemia, unspecified: Secondary | ICD-10-CM | POA: Diagnosis not present

## 2017-10-26 DIAGNOSIS — J309 Allergic rhinitis, unspecified: Secondary | ICD-10-CM | POA: Diagnosis not present

## 2017-10-26 DIAGNOSIS — E1169 Type 2 diabetes mellitus with other specified complication: Secondary | ICD-10-CM | POA: Diagnosis not present

## 2018-01-11 ENCOUNTER — Other Ambulatory Visit: Payer: Self-pay | Admitting: Obstetrics and Gynecology

## 2018-01-11 ENCOUNTER — Other Ambulatory Visit (HOSPITAL_COMMUNITY)
Admission: RE | Admit: 2018-01-11 | Discharge: 2018-01-11 | Disposition: A | Discharge status: Home / Self Care | Payer: Federal, State, Local not specified - PPO | Source: Ambulatory Visit | Attending: Obstetrics and Gynecology | Admitting: Obstetrics and Gynecology

## 2018-01-11 DIAGNOSIS — R300 Dysuria: Secondary | ICD-10-CM | POA: Diagnosis not present

## 2018-01-11 DIAGNOSIS — Z01419 Encounter for gynecological examination (general) (routine) without abnormal findings: Secondary | ICD-10-CM | POA: Insufficient documentation

## 2018-01-11 DIAGNOSIS — R350 Frequency of micturition: Secondary | ICD-10-CM | POA: Diagnosis not present

## 2018-01-12 LAB — CYTOLOGY - PAP
Diagnosis: NEGATIVE
HPV: NOT DETECTED

## 2018-01-27 DIAGNOSIS — E1169 Type 2 diabetes mellitus with other specified complication: Secondary | ICD-10-CM | POA: Diagnosis not present

## 2018-02-14 DIAGNOSIS — R05 Cough: Secondary | ICD-10-CM | POA: Diagnosis not present

## 2018-02-14 DIAGNOSIS — R5383 Other fatigue: Secondary | ICD-10-CM | POA: Diagnosis not present

## 2018-02-24 DIAGNOSIS — Z23 Encounter for immunization: Secondary | ICD-10-CM | POA: Diagnosis not present

## 2018-02-24 DIAGNOSIS — L259 Unspecified contact dermatitis, unspecified cause: Secondary | ICD-10-CM | POA: Diagnosis not present

## 2018-03-10 DIAGNOSIS — L0390 Cellulitis, unspecified: Secondary | ICD-10-CM | POA: Diagnosis not present

## 2018-03-10 DIAGNOSIS — R21 Rash and other nonspecific skin eruption: Secondary | ICD-10-CM | POA: Diagnosis not present

## 2018-03-17 DIAGNOSIS — L308 Other specified dermatitis: Secondary | ICD-10-CM | POA: Diagnosis not present

## 2018-04-25 ENCOUNTER — Other Ambulatory Visit: Payer: Self-pay | Admitting: Physician Assistant

## 2018-04-25 DIAGNOSIS — Z1231 Encounter for screening mammogram for malignant neoplasm of breast: Secondary | ICD-10-CM

## 2018-04-27 DIAGNOSIS — E1169 Type 2 diabetes mellitus with other specified complication: Secondary | ICD-10-CM | POA: Diagnosis not present

## 2018-04-27 DIAGNOSIS — E7800 Pure hypercholesterolemia, unspecified: Secondary | ICD-10-CM | POA: Diagnosis not present

## 2018-06-16 IMAGING — MG DIGITAL SCREENING BILATERAL MAMMOGRAM WITH TOMO AND CAD
6 of 9 series · 6 of 25 positions shown · non-contrast
Comparison: Previous exam(s).

CLINICAL DATA: Screening.

EXAM:
DIGITAL SCREENING BILATERAL MAMMOGRAM WITH TOMO AND CAD

[R CC]
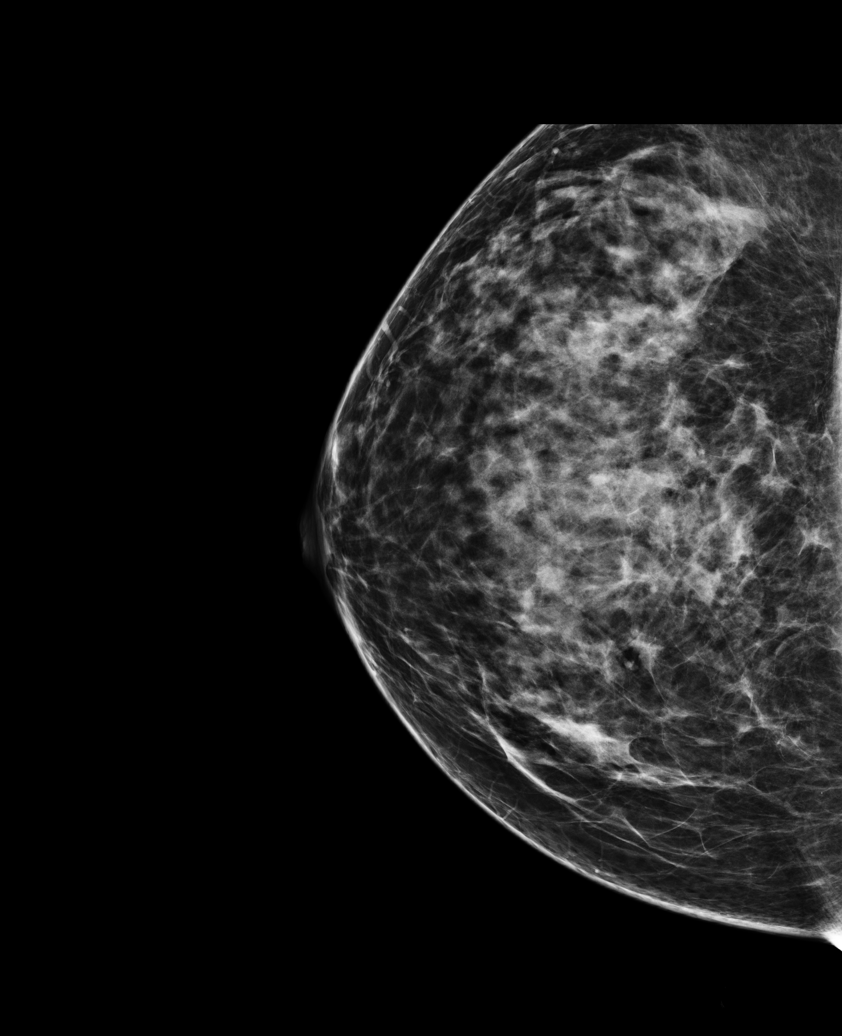

[L CC synth-2D]
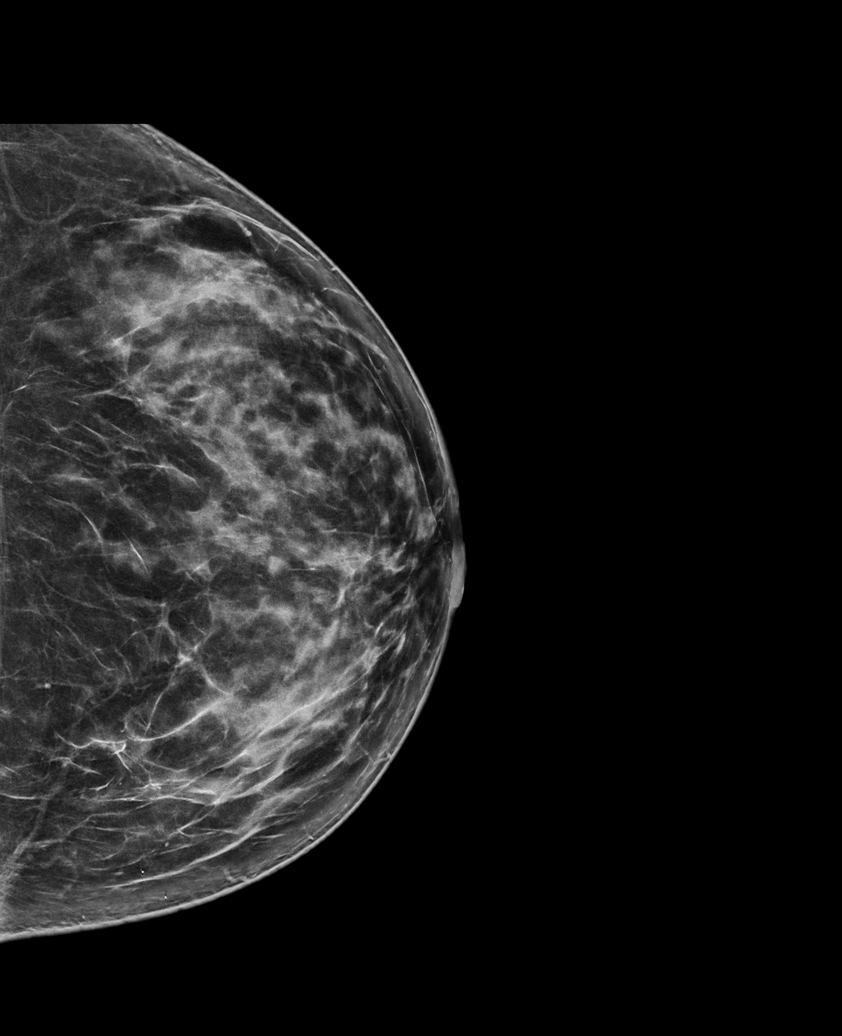

[R MLO synth-2D]
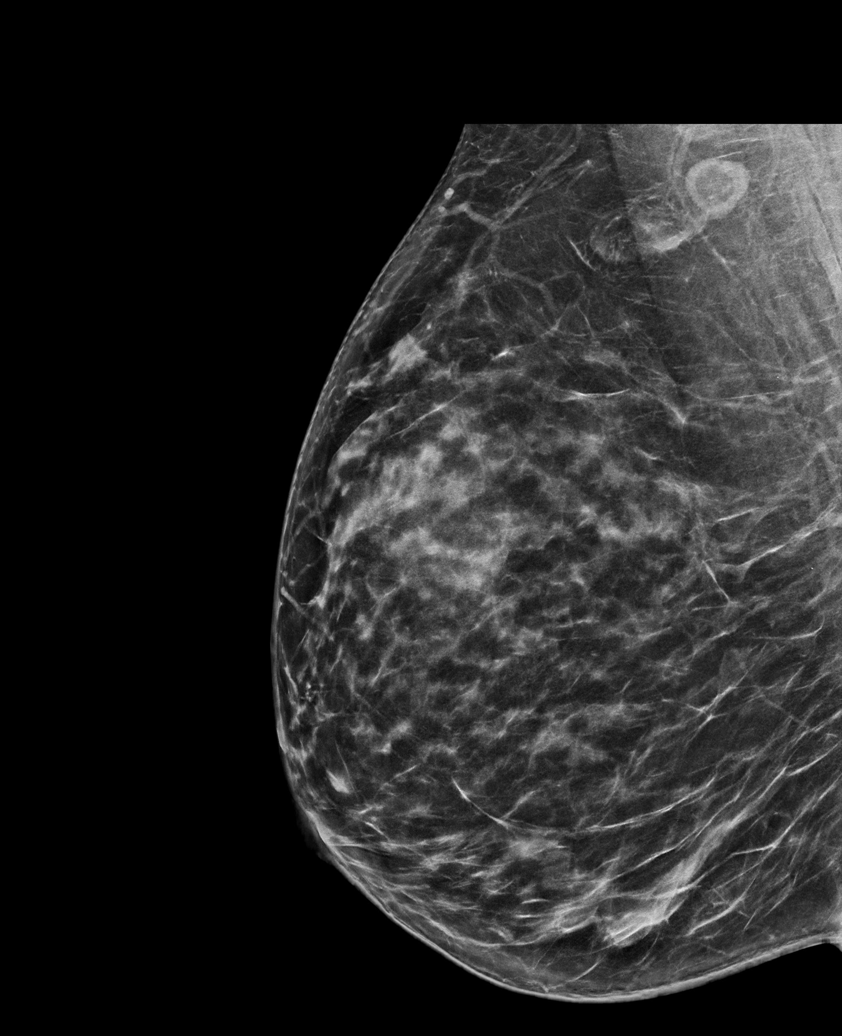

[R CC synth-2D]
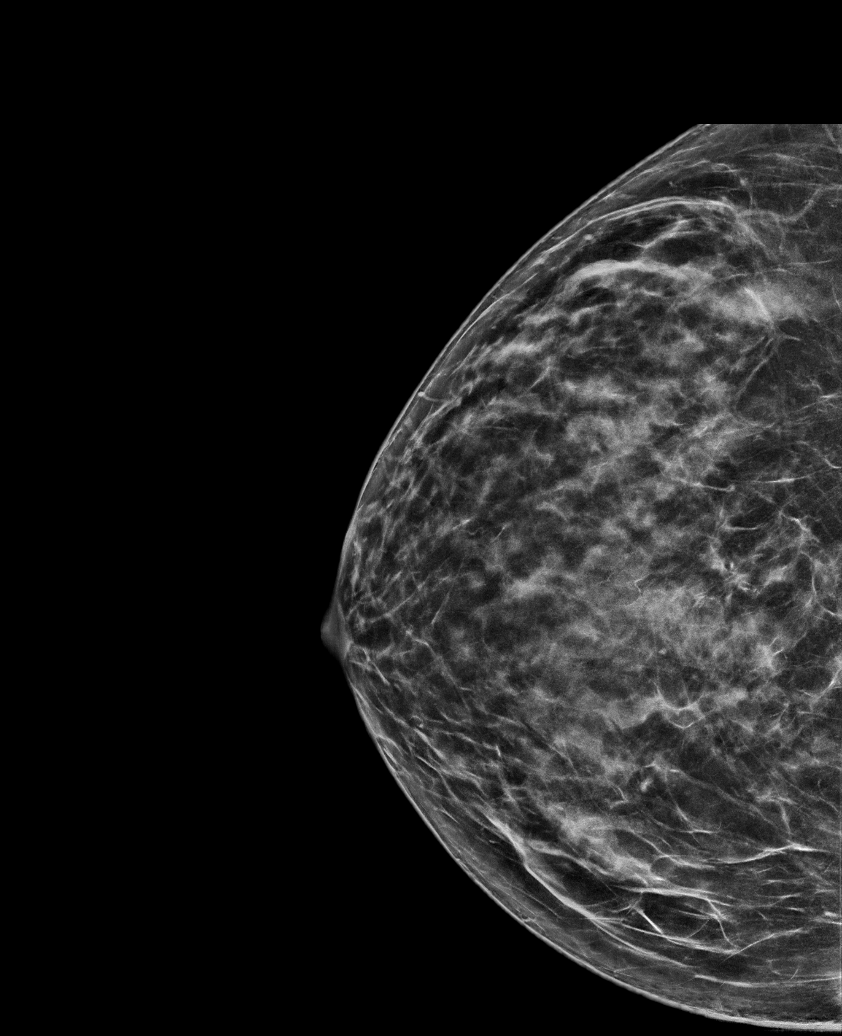

[L MLO synth-2D]
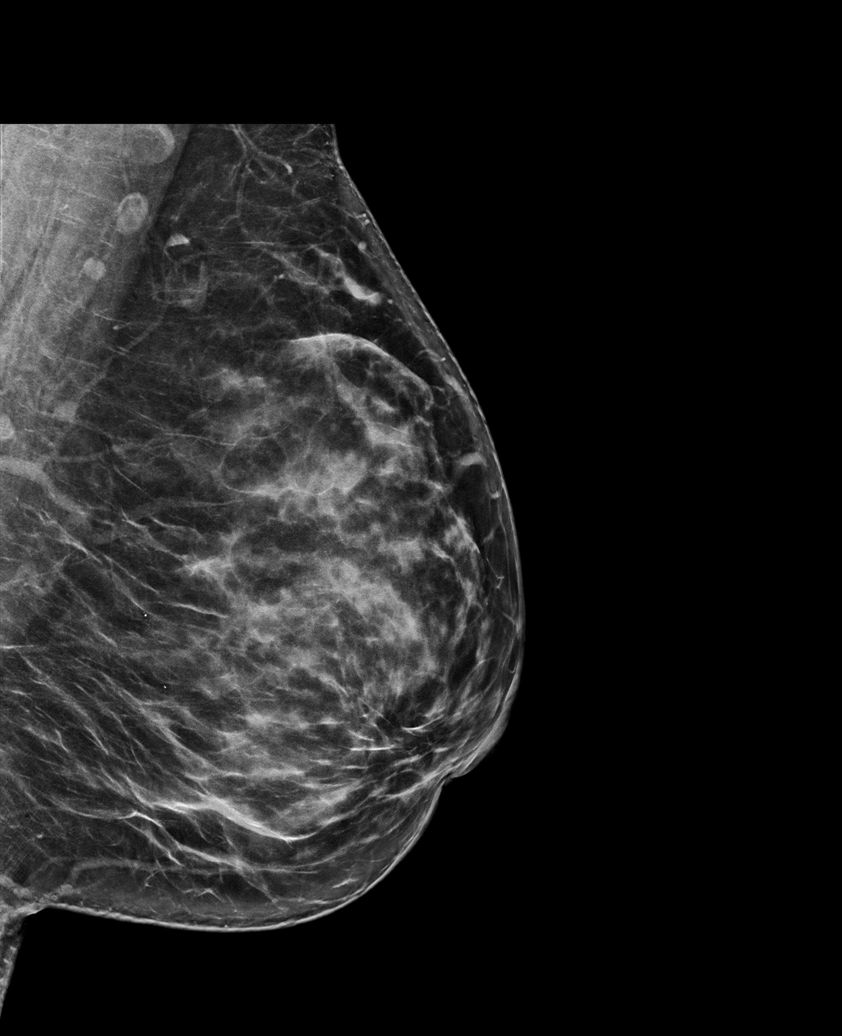

[L CC tomo · tomo slice 39/76.0]
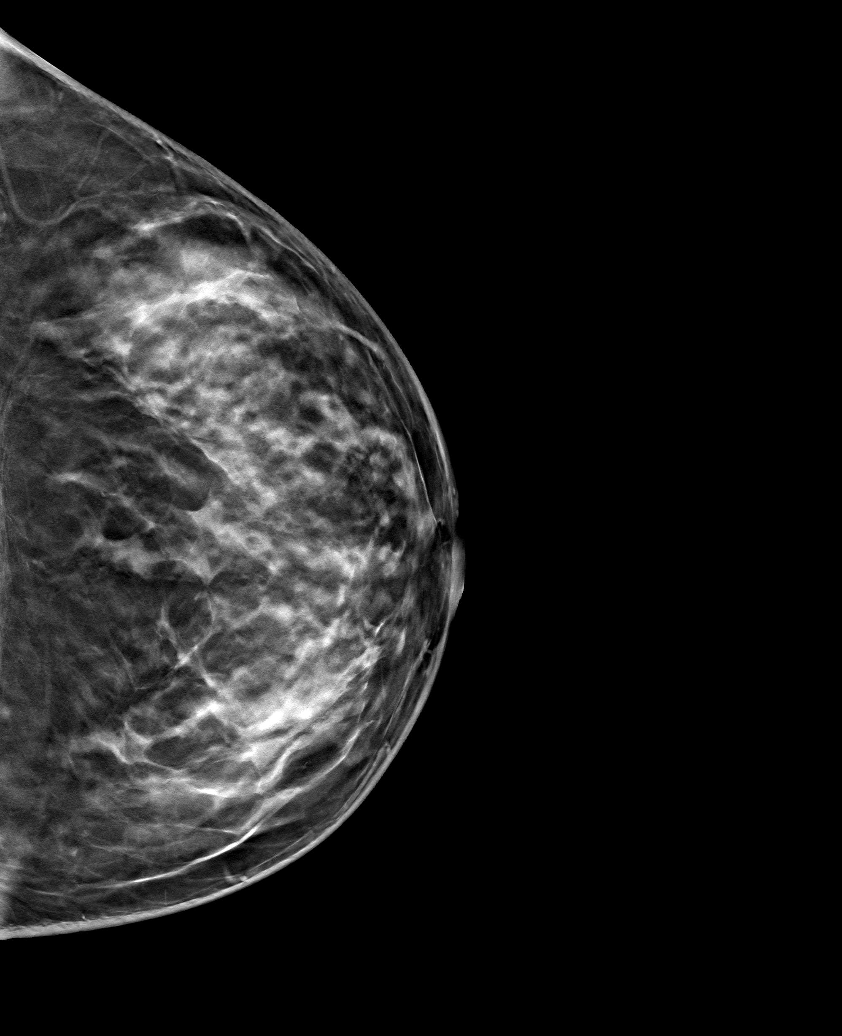

[6 of 25 positions shown; findings below may reference images not displayed]

ACR Breast Density Category c: The breast tissue is heterogeneously
dense, which may obscure small masses.
FINDINGS: There are no findings suspicious for malignancy. Images were
processed with CAD.
IMPRESSION: No mammographic evidence of malignancy. A result letter of this
screening mammogram will be mailed directly to the patient.

RECOMMENDATION:
Screening mammogram in one year. (Code:[id number])

BI-RADS CATEGORY  1: Negative.

## 2018-06-30 ENCOUNTER — Ambulatory Visit
Admission: RE | Admit: 2018-06-30 | Discharge: 2018-06-30 | Disposition: A | Discharge status: Home / Self Care | Payer: Federal, State, Local not specified - PPO | Source: Ambulatory Visit | Attending: Physician Assistant | Admitting: Physician Assistant

## 2018-06-30 DIAGNOSIS — Z1231 Encounter for screening mammogram for malignant neoplasm of breast: Secondary | ICD-10-CM

## 2018-11-03 DIAGNOSIS — E1169 Type 2 diabetes mellitus with other specified complication: Secondary | ICD-10-CM | POA: Diagnosis not present

## 2018-11-03 DIAGNOSIS — R809 Proteinuria, unspecified: Secondary | ICD-10-CM | POA: Diagnosis not present

## 2018-11-03 DIAGNOSIS — E7800 Pure hypercholesterolemia, unspecified: Secondary | ICD-10-CM | POA: Diagnosis not present

## 2018-11-03 DIAGNOSIS — Z0000 Encounter for general adult medical examination without abnormal findings: Secondary | ICD-10-CM | POA: Diagnosis not present

## 2018-11-08 DIAGNOSIS — E7800 Pure hypercholesterolemia, unspecified: Secondary | ICD-10-CM | POA: Diagnosis not present

## 2018-11-08 DIAGNOSIS — Z7984 Long term (current) use of oral hypoglycemic drugs: Secondary | ICD-10-CM | POA: Diagnosis not present

## 2018-11-08 DIAGNOSIS — E1169 Type 2 diabetes mellitus with other specified complication: Secondary | ICD-10-CM | POA: Diagnosis not present

## 2018-11-09 DIAGNOSIS — L03019 Cellulitis of unspecified finger: Secondary | ICD-10-CM | POA: Diagnosis not present

## 2018-11-11 DIAGNOSIS — S46811A Strain of other muscles, fascia and tendons at shoulder and upper arm level, right arm, initial encounter: Secondary | ICD-10-CM | POA: Diagnosis not present

## 2019-01-13 DIAGNOSIS — Z01419 Encounter for gynecological examination (general) (routine) without abnormal findings: Secondary | ICD-10-CM | POA: Diagnosis not present

## 2019-02-11 DIAGNOSIS — Z23 Encounter for immunization: Secondary | ICD-10-CM | POA: Diagnosis not present

## 2019-03-12 ENCOUNTER — Emergency Department: Payer: Federal, State, Local not specified - PPO

## 2019-03-12 ENCOUNTER — Encounter: Payer: Self-pay | Admitting: Intensive Care

## 2019-03-12 ENCOUNTER — Other Ambulatory Visit: Payer: Self-pay

## 2019-03-12 ENCOUNTER — Emergency Department
Admission: EM | Admit: 2019-03-12 | Discharge: 2019-03-12 | Disposition: A | Discharge status: Home / Self Care | Payer: Federal, State, Local not specified - PPO | Attending: Emergency Medicine | Admitting: Emergency Medicine

## 2019-03-12 DIAGNOSIS — R109 Unspecified abdominal pain: Secondary | ICD-10-CM | POA: Diagnosis not present

## 2019-03-12 DIAGNOSIS — R1032 Left lower quadrant pain: Secondary | ICD-10-CM | POA: Diagnosis not present

## 2019-03-12 DIAGNOSIS — Z79899 Other long term (current) drug therapy: Secondary | ICD-10-CM | POA: Diagnosis not present

## 2019-03-12 DIAGNOSIS — R519 Headache, unspecified: Secondary | ICD-10-CM

## 2019-03-12 DIAGNOSIS — Z7982 Long term (current) use of aspirin: Secondary | ICD-10-CM | POA: Insufficient documentation

## 2019-03-12 DIAGNOSIS — R110 Nausea: Secondary | ICD-10-CM | POA: Diagnosis not present

## 2019-03-12 DIAGNOSIS — R200 Anesthesia of skin: Secondary | ICD-10-CM | POA: Diagnosis not present

## 2019-03-12 DIAGNOSIS — Z7984 Long term (current) use of oral hypoglycemic drugs: Secondary | ICD-10-CM | POA: Diagnosis not present

## 2019-03-12 DIAGNOSIS — J45909 Unspecified asthma, uncomplicated: Secondary | ICD-10-CM | POA: Insufficient documentation

## 2019-03-12 DIAGNOSIS — R202 Paresthesia of skin: Secondary | ICD-10-CM

## 2019-03-12 DIAGNOSIS — Z20828 Contact with and (suspected) exposure to other viral communicable diseases: Secondary | ICD-10-CM | POA: Diagnosis not present

## 2019-03-12 LAB — URINALYSIS, COMPLETE (UACMP) WITH MICROSCOPIC
Bacteria, UA: NONE SEEN
Bilirubin Urine: NEGATIVE
Glucose, UA: >=500 mg/dL — AB
Hgb urine dipstick: NEGATIVE
Ketones, ur: NEGATIVE mg/dL
Leukocytes,Ua: NEGATIVE
Nitrite: NEGATIVE
Protein, ur: NEGATIVE mg/dL
Specific Gravity, Urine: 1.02 (ref 1.005–1.030)
pH: 5 (ref 5.0–8.0)

## 2019-03-12 LAB — COMPREHENSIVE METABOLIC PANEL
ALT: 21 U/L (ref 0–44)
AST: 19 U/L (ref 15–41)
Albumin: 4.4 g/dL (ref 3.5–5.0)
Alkaline Phosphatase: 68 U/L (ref 38–126)
Anion gap: 12 (ref 5–15)
BUN: 11 mg/dL (ref 6–20)
CO2: 25 mmol/L (ref 22–32)
Calcium: 9.4 mg/dL (ref 8.9–10.3)
Chloride: 103 mmol/L (ref 98–111)
Creatinine, Ser: 0.81 mg/dL (ref 0.44–1.00)
GFR calc Af Amer: >60 mL/min (ref >60)
GFR calc non Af Amer: >60 mL/min (ref >60)
Glucose, Bld: 155 mg/dL — ABNORMAL HIGH (ref 70–99)
Potassium: 3.6 mmol/L (ref 3.5–5.1)
Sodium: 140 mmol/L (ref 135–145)
Total Bilirubin: 1.3 mg/dL — ABNORMAL HIGH (ref 0.3–1.2)
Total Protein: 8.2 g/dL — ABNORMAL HIGH (ref 6.5–8.1)

## 2019-03-12 LAB — CBC
HCT: 46 % (ref 36.0–46.0)
Hemoglobin: 14.6 g/dL (ref 12.0–15.0)
MCH: 27.9 pg (ref 26.0–34.0)
MCHC: 31.7 g/dL (ref 30.0–36.0)
MCV: 87.8 fL (ref 80.0–100.0)
Platelets: 212 10*3/uL (ref 150–400)
RBC: 5.24 MIL/uL — ABNORMAL HIGH (ref 3.87–5.11)
RDW: 12.7 % (ref 11.5–15.5)
WBC: 5.1 10*3/uL (ref 4.0–10.5)
nRBC: 0 % (ref 0.0–0.2)

## 2019-03-12 LAB — LIPASE, BLOOD: Lipase: 30 U/L (ref 11–51)

## 2019-03-12 MED ORDER — ACETAMINOPHEN 500 MG PO TABS
1000.0000 mg | ORAL_TABLET | Freq: Once | ORAL | Status: AC
  Administered 2019-03-12: 1000 mg via ORAL
  Filled 2019-03-12: qty 2

## 2019-03-12 NOTE — ED Notes (Signed)
Pt taken to CT.

## 2019-03-12 NOTE — ED Notes (Signed)
First Nurse Note: Pt to first nurse desk. Pt upset that she has been waiting so long. Pt requesting to know when she will seen. Pt informed that I cannot give her a time frame but that she is the longest wait. Pt request to know how we determine acuity, pt was informed that acuity is based off of chief complaint, symptoms, and vitals signs. Pt wanting to know why she cannot be seen in flex. Pt informed that due to CC she needed to be seen in the major care area. Pt stated "you have to be able to tell me how long it is going to be". RN again informed her that I have no way to determine wait time. Pt states that "this is ridiculous" and request to speak with the charge nurse.  Theadora Rama, [place] Nurse informed that pt would like to speak with her about wait time.

## 2019-03-12 NOTE — ED Provider Notes (Signed)
[facility] Emergency Department Provider Note  ____________________________________________   First MD Initiated Contact with Patient 03/12/19 1551     (approximate)  I have reviewed the triage vital signs and the nursing notes.   HISTORY  Chief Complaint Abdominal Pain and Numbness (left side of face)    HPI Lauren Caldwell is a 48 y.o. female diabetes, prior migraines who presents with headaches and abdominal pain.  Patient has had intermittent migraines for in her life.  Today she had a headache that started this morning that was gradual in onset.  The pain was moderate, constant, associated with some left facial tingling, nothing made it better, nothing made it worse.  The headache has now gone down intensity in the tingling has since resolved.  She endorses having some tingling sensations in the past that have resolved on its own.  This episode lasted about 1 to 2 hours.  She denies any aphasia or arm or leg weakness associated with it.  She is also been having some left flank pain this been intermittent for the past 1 month.  This is been associate with intermittent nausea for the past 2 weeks which is new for her.  She denies any urinary symptoms.  Denies any shortness of breath or cough.  However every morning when she wakes up she has some sputum and this morning she had a very tiny amount of blood tinged sputum.  However she does endorse having a nosebleed day prior she says she gets nosebleeds every 1 week.        Past Medical History:  Diagnosis Date   Diabetes mellitus without complication (HCC)    Hypercholesteremia    Migraine     Patient Active Problem List   Diagnosis Date Noted   Palpitations 03/02/2016   Migraine without aura and without status migrainosus, not intractable 12/03/2014   OSA (obstructive sleep apnea) 12/03/2014    Past Surgical History:  Procedure Laterality Date   BREAST BIOPSY Left 2004   TUBAL LIGATION        Prior to Admission medications   Medication Sig Start Date End Date Taking? Authorizing Provider  aspirin 81 MG tablet Take 81 mg by mouth daily. Reported on 10/15/2015    [provider]  atorvastatin (LIPITOR) 20 MG tablet Take 20 mg by mouth daily.    [provider]  benzonatate (TESSALON) 200 MG capsule  09/28/15   [provider]  dapagliflozin propanediol (FARXIGA) 5 MG TABS tablet Take 5 mg by mouth daily.    [provider]  doxycycline (VIBRAMYCIN) 100 MG capsule  09/28/15   [provider]  fluticasone (FLONASE) 50 MCG/ACT nasal spray  10/14/15   [provider]  glimepiride (AMARYL) 4 MG tablet Take 4 mg by mouth daily before breakfast.    [provider]  losartan (COZAAR) 25 MG tablet  11/03/14   [provider]  metFORMIN (GLUCOPHAGE) 500 MG tablet Take 500 mg by mouth 2 (two) times daily with a meal.    [provider]  ondansetron (ZOFRAN) 4 MG tablet Take 1 tablet (4 mg total) by mouth every 6 (six) hours. 07/03/12   Jethro Bastos, NP  oxyCODONE-acetaminophen (PERCOCET/ROXICET) 5-325 MG per tablet Take 1 tablet by mouth every 4 (four) hours as needed for pain. 07/03/12   Jethro Bastos, NP  PROAIR HFA 108 [phone] BASE) MCG/ACT inhaler  08/29/14   [provider]    Allergies Patient has no known  allergies.  Family History  Problem Relation Age of Onset   Diabetes Mother    Hypertension Mother    Diabetes Brother    Kidney failure Other    Diabetes Maternal Grandmother    Heart failure Maternal Grandfather     Social History Social History   Tobacco Use   Smoking status: Never Smoker   Smokeless tobacco: Never Used  Substance Use Topics   Alcohol use: Yes    Alcohol/week: 0.0 standard drinks    Comment: socially   Drug use: No      Review of Systems Constitutional: No fever/chills Eyes: No visual changes. ENT: No sore throat. Cardiovascular: Denies chest  pain. Respiratory: Denies shortness of breath. Gastrointestinal: Left flank pain, nausea Genitourinary: Negative for dysuria. Musculoskeletal: Negative for back pain. Skin: Negative for rash. Neurological:, Tingling sensation, headache  All other ROS negative ____________________________________________   PHYSICAL EXAM:  VITAL SIGNS: ED Triage Vitals  Enc Vitals Group     BP 03/12/19 1121 118/77     Pulse Rate 03/12/19 1121 85     Resp 03/12/19 1121 14     Temp 03/12/19 1121 99.4 F (37.4 C)     Temp Source 03/12/19 1121 Oral     SpO2 03/12/19 1121 97 %     Weight 03/12/19 1118 210 lb (95.3 kg)     Height 03/12/19 1118 5\' 11"  (1.803 m)     Head Circumference --      Peak Flow --      Pain Score 03/12/19 1118 0     Pain Loc --      Pain Edu? --      Excl. in [place]? --     Constitutional: Alert and oriented. Well appearing and in no acute distress. Eyes: Conjunctivae are normal. EOMI. Head: Atraumatic. Nose: No congestion/rhinnorhea. Mouth/Throat: Mucous membranes are moist.   Neck: No stridor. Trachea Midline. FROM Cardiovascular: Normal rate, regular rhythm. Grossly normal heart sounds.  Good peripheral circulation. Respiratory: Normal respiratory effort.  No retractions. Lungs CTAB. Gastrointestinal: Soft and nontender but reported left flank tenderness.  No distention. No abdominal bruits.  Musculoskeletal: No lower extremity tenderness nor edema.  No joint effusions. Neurologic: Nerves II through XII are intact.  Equal strength in her arms and the legs. Skin:  Skin is warm, dry and intact. No rash noted. Psychiatric: Mood and affect are normal. Speech and behavior are normal. GU: Deferred   ____________________________________________   LABS (all labs ordered are listed, but only abnormal results are displayed)  Labs Reviewed  COMPREHENSIVE METABOLIC PANEL - Abnormal; Notable for the following components:      Result Value   Glucose, Bld 155 (*)    Total  Protein 8.2 (*)    Total Bilirubin 1.3 (*)    All other components within normal limits  CBC - Abnormal; Notable for the following components:   RBC 5.24 (*)    All other components within normal limits  URINALYSIS, COMPLETE (UACMP) WITH MICROSCOPIC - Abnormal; Notable for the following components:   Color, Urine YELLOW (*)    APPearance CLEAR (*)    Glucose, UA >=500 (*)    All other components within normal limits  SARS CORONAVIRUS 2 (TAT 6-24 HRS)  LIPASE, BLOOD   ____________________________________________  RADIOLOGY Robert Bellow, personally viewed and evaluated these images (plain radiographs) as part of my medical decision making, as well as reviewing the written report by the radiologist.  ED MD interpretation:  No PNA on  xray   Official radiology report(s): Dg Chest 2 View  Result Date: 03/12/2019 CLINICAL DATA:  Abdominal pain.  Nausea. EXAM: CHEST - 2 VIEW COMPARISON:  December 20, 2015 FINDINGS: The heart size and mediastinal contours are within normal limits. Both lungs are clear. The visualized skeletal structures are unremarkable. IMPRESSION: No active cardiopulmonary disease. Electronically Signed   By: Gerome Samavid  Williams III M.D   On: 03/12/2019 16:27   Ct Head Wo Contrast  Result Date: 03/12/2019 CLINICAL DATA:  Patient c/o abd pain with nausea for awhile now. Also reports she spit up phlegm this AM and it had blood in it and noticed the left side of her face was numb around 10ish today. No weakness in extremities. No facial droop. Speech clear. Ambulatory with no problems EXAM: CT HEAD WITHOUT CONTRAST TECHNIQUE: Contiguous axial images were obtained from the base of the skull through the vertex without intravenous contrast. COMPARISON:  11/22/2014 FINDINGS: Brain: No evidence of acute infarction, hemorrhage, hydrocephalus, extra-axial collection or mass lesion/mass effect. Vascular: No hyperdense vessel or unexpected calcification. Skull: Normal. Negative for fracture or  focal lesion. Sinuses/Orbits: Visualized globes and orbits are unremarkable. The visualized sinuses and mastoid air cells are clear. Other: None. IMPRESSION: Normal unenhanced CT scan of the brain. Electronically Signed   By: Amie Portlandavid  Ormond M.D.   On: 03/12/2019 16:52   Ct Renal Stone Study  Result Date: 03/12/2019 CLINICAL DATA:  Patient c/o abd pain with nausea for awhile now. Also reports she spit up phlegm this AM and it had blood in it and noticed the left side of her face was numb around 10ish today. No weakness in extremities. No facial droop. Speech clear. Ambulatory with no problems EXAM: CT ABDOMEN AND PELVIS WITHOUT CONTRAST TECHNIQUE: Multidetector CT imaging of the abdomen and pelvis was performed following the standard protocol without IV contrast. COMPARISON:  07/03/2012 FINDINGS: Lower chest: Clear lung bases. Hepatobiliary: No focal liver abnormality is seen. No gallstones, gallbladder wall thickening, or biliary dilatation. Pancreas: Unremarkable. No pancreatic ductal dilatation or surrounding inflammatory changes. Spleen: Normal in size without focal abnormality. Adrenals/Urinary Tract: Adrenal glands are unremarkable. Kidneys are normal, without renal calculi, focal lesion, or hydronephrosis. Bladder is unremarkable. Stomach/Bowel: Normal stomach and small bowel. Colon normal in caliber. No wall thickening or inflammation. Mild increased colonic stool burden. Normal appendix visualized. Vascular/Lymphatic: Minor aortic atherosclerotic calcifications. No aneurysm vessels otherwise unremarkable. No enlarged lymph nodes. Reproductive: Uterus and bilateral adnexa are unremarkable. Other: No abdominal wall hernia or abnormality. No abdominopelvic ascites. Musculoskeletal: No acute or significant osseous findings. IMPRESSION: 1. No acute findings. 2. Mild increase in colonic stool burden.  No bowel inflammation. 3. Minor aortic atherosclerotic calcifications. Electronically Signed   By: Amie Portlandavid   Ormond M.D.   On: 03/12/2019 16:50    ____________________________________________   PROCEDURES  Procedure(s) performed (including Critical Care):  Procedures   ____________________________________________   INITIAL IMPRESSION / ASSESSMENT AND PLAN / ED COURSE  Lauren Caldwell was evaluated in Emergency Department on 03/12/2019 for the symptoms described in the history of present illness. She was evaluated in the context of the global COVID-19 pandemic, which necessitated consideration that the patient might be at risk for infection with the SARS-CoV-2 virus that causes COVID-19. Institutional protocols and algorithms that pertain to the evaluation of patients at risk for COVID-19 are in a state of rapid change based on information released by regulatory bodies including the CDC and federal and state organizations. These policies and algorithms were followed during the  patient's care in the ED.    Patient presents with multiple concerns including headaches, tingling sensation, abdominal pain, nausea.  She also had one episode of blood-tinged sputum after a nosebleed.  Patient says that she is just very anxious that she might have cancer.  She says that she researched this on the Internet.  Discussed with patient will get CT scan to evaluate for intracranial hemorrhage, mass, stroke although patient is got a nonfocal neuro exam at this time I have very low suspicion for ischemic stroke. Does not sound like SAH.  Will get chest x-ray to evaluate for pneumonia.  We discussed the possibility of this being a pulmonary embolism but the blood-tinged sputum is most likely from her nosebleed previously and she is not short of breath.  We have elected to perform further testing for this given her very low suspicion.  Will get CT scan to evaluate for mass versus kidney stone given the left flank tenderness and nausea.    Labs are reassuring.  No evidence of UTI.  Chest x-ray without evidence of  pneumonia.  CT abdomen negative except for some stool burden which I recommend taking MiraLAX for.  CT head was negative.  Evaluated patient she is feeling much better after the Tylenol.  Patient is relieved with the above work-up.  Patient is requesting coronavirus testing.  Patient understands she should stay quarantined at home until results come back.  Patient feels comfortable with discharge home at this time.      ____________________________________________   FINAL CLINICAL IMPRESSION(S) / ED DIAGNOSES   Final diagnoses:  Paresthesia  Intractable headache, unspecified chronicity pattern, unspecified headache type  Nausea  Flank pain      MEDICATIONS GIVEN DURING THIS VISIT:  Medications  acetaminophen (TYLENOL) tablet 1,000 mg (1,000 mg Oral Given 03/12/19 1610)     ED Discharge Orders    None       Note:  This document was prepared using Dragon voice recognition software and may include unintentional dictation errors.   Concha Se, MD 03/12/19 [phone]

## 2019-03-12 NOTE — ED Triage Notes (Signed)
Patient c/o abd pain with nausea for awhile now. Also reports she spit up phlegm this AM and it had blood in it and noticed the left side of her face was numb around 10ish today. Patient crying in triage saying "I am scared and this covid stuff has me all worked up." No weakness in extremities. No facial droop. Speech clear. Ambulatory with no problems

## 2019-03-12 NOTE — Discharge Instructions (Signed)
Work-up was reassuring.  You should stay quarantine at home until your coronavirus testing is negative.  You take 1 capful of MiraLAX daily to help with some constipation.

## 2019-03-12 NOTE — ED Notes (Signed)
First Nurse Note: Pt came in to ask if we had forgotten about her. Pt informed that we had not forgotten about her that she has 3 people who have been waiting longer than her, however, pt are not seen based on wait time but on their acuity level so that may change at any time. Pt verbalized understanding.

## 2019-03-12 NOTE — ED Notes (Signed)
First Nurse Note: Pt to front desk asking how many people were ahead of her. Pt informed one person has been waiting longer than her, however, pts are not seen based on wait time so that may change.

## 2019-03-12 NOTE — ED Notes (Signed)
First Nurse Note: Pt to front desk asking about lab results. Pt was informed results are back, however, I cannot give her the results, she will have to see MD.

## 2019-03-12 NOTE — ED Notes (Signed)
Pt in xray

## 2019-03-13 LAB — SARS CORONAVIRUS 2 (TAT 6-24 HRS): SARS Coronavirus 2: NEGATIVE

## 2019-03-30 DIAGNOSIS — G43909 Migraine, unspecified, not intractable, without status migrainosus: Secondary | ICD-10-CM | POA: Diagnosis not present

## 2019-03-30 DIAGNOSIS — R1310 Dysphagia, unspecified: Secondary | ICD-10-CM | POA: Diagnosis not present

## 2019-03-31 ENCOUNTER — Other Ambulatory Visit: Payer: Self-pay | Admitting: Physician Assistant

## 2019-03-31 DIAGNOSIS — R1310 Dysphagia, unspecified: Secondary | ICD-10-CM

## 2019-04-09 DIAGNOSIS — Z20828 Contact with and (suspected) exposure to other viral communicable diseases: Secondary | ICD-10-CM | POA: Diagnosis not present

## 2019-04-11 ENCOUNTER — Other Ambulatory Visit: Payer: Federal, State, Local not specified - PPO

## 2019-04-25 ENCOUNTER — Ambulatory Visit
Admission: RE | Admit: 2019-04-25 | Discharge: 2019-04-25 | Disposition: A | Discharge status: Home / Self Care | Payer: Federal, State, Local not specified - PPO | Source: Ambulatory Visit | Attending: Physician Assistant | Admitting: Physician Assistant

## 2019-04-25 DIAGNOSIS — K224 Dyskinesia of esophagus: Secondary | ICD-10-CM | POA: Diagnosis not present

## 2019-04-25 DIAGNOSIS — R1310 Dysphagia, unspecified: Secondary | ICD-10-CM

## 2019-05-08 ENCOUNTER — Ambulatory Visit: Payer: Federal, State, Local not specified - PPO | Attending: Internal Medicine

## 2019-05-08 DIAGNOSIS — E559 Vitamin D deficiency, unspecified: Secondary | ICD-10-CM | POA: Diagnosis not present

## 2019-05-08 DIAGNOSIS — R809 Proteinuria, unspecified: Secondary | ICD-10-CM | POA: Diagnosis not present

## 2019-05-08 DIAGNOSIS — Z20828 Contact with and (suspected) exposure to other viral communicable diseases: Secondary | ICD-10-CM | POA: Diagnosis not present

## 2019-05-08 DIAGNOSIS — Z20822 Contact with and (suspected) exposure to covid-19: Secondary | ICD-10-CM

## 2019-05-08 DIAGNOSIS — E7800 Pure hypercholesterolemia, unspecified: Secondary | ICD-10-CM | POA: Diagnosis not present

## 2019-05-08 DIAGNOSIS — E1169 Type 2 diabetes mellitus with other specified complication: Secondary | ICD-10-CM | POA: Diagnosis not present

## 2019-05-10 LAB — NOVEL CORONAVIRUS, NAA: SARS-CoV-2, NAA: DETECTED — AB

## 2019-05-25 DIAGNOSIS — E7800 Pure hypercholesterolemia, unspecified: Secondary | ICD-10-CM | POA: Diagnosis not present

## 2019-05-25 DIAGNOSIS — E559 Vitamin D deficiency, unspecified: Secondary | ICD-10-CM | POA: Diagnosis not present

## 2019-05-25 DIAGNOSIS — R05 Cough: Secondary | ICD-10-CM | POA: Diagnosis not present

## 2019-05-25 DIAGNOSIS — Z8616 Personal history of COVID-19: Secondary | ICD-10-CM | POA: Diagnosis not present

## 2019-05-25 DIAGNOSIS — E1169 Type 2 diabetes mellitus with other specified complication: Secondary | ICD-10-CM | POA: Diagnosis not present

## 2019-05-30 ENCOUNTER — Other Ambulatory Visit: Payer: Self-pay | Admitting: Physician Assistant

## 2019-05-30 DIAGNOSIS — Z1231 Encounter for screening mammogram for malignant neoplasm of breast: Secondary | ICD-10-CM

## 2019-06-21 DIAGNOSIS — Z1211 Encounter for screening for malignant neoplasm of colon: Secondary | ICD-10-CM | POA: Diagnosis not present

## 2019-06-21 DIAGNOSIS — K5900 Constipation, unspecified: Secondary | ICD-10-CM | POA: Diagnosis not present

## 2019-06-21 DIAGNOSIS — R1032 Left lower quadrant pain: Secondary | ICD-10-CM | POA: Diagnosis not present

## 2019-06-26 DIAGNOSIS — E1169 Type 2 diabetes mellitus with other specified complication: Secondary | ICD-10-CM | POA: Diagnosis not present

## 2019-06-26 DIAGNOSIS — M778 Other enthesopathies, not elsewhere classified: Secondary | ICD-10-CM | POA: Diagnosis not present

## 2019-07-07 ENCOUNTER — Ambulatory Visit
Admission: RE | Admit: 2019-07-07 | Discharge: 2019-07-07 | Disposition: A | Discharge status: Home / Self Care | Payer: Federal, State, Local not specified - PPO | Source: Ambulatory Visit | Attending: Physician Assistant | Admitting: Physician Assistant

## 2019-07-07 ENCOUNTER — Other Ambulatory Visit: Payer: Self-pay

## 2019-07-07 DIAGNOSIS — Z1231 Encounter for screening mammogram for malignant neoplasm of breast: Secondary | ICD-10-CM

## 2019-07-27 ENCOUNTER — Ambulatory Visit
Admission: RE | Admit: 2019-07-27 | Discharge: 2019-07-27 | Disposition: A | Discharge status: Home / Self Care | Payer: Federal, State, Local not specified - PPO | Source: Ambulatory Visit | Attending: Physician Assistant | Admitting: Physician Assistant

## 2019-07-27 ENCOUNTER — Other Ambulatory Visit: Payer: Self-pay | Admitting: Physician Assistant

## 2019-07-27 DIAGNOSIS — R0602 Shortness of breath: Secondary | ICD-10-CM

## 2019-07-27 DIAGNOSIS — R0981 Nasal congestion: Secondary | ICD-10-CM | POA: Diagnosis not present

## 2019-07-27 DIAGNOSIS — R52 Pain, unspecified: Secondary | ICD-10-CM

## 2019-07-27 DIAGNOSIS — M25522 Pain in left elbow: Secondary | ICD-10-CM | POA: Diagnosis not present

## 2019-07-27 DIAGNOSIS — Z8616 Personal history of COVID-19: Secondary | ICD-10-CM | POA: Diagnosis not present

## 2019-07-27 DIAGNOSIS — M79602 Pain in left arm: Secondary | ICD-10-CM | POA: Diagnosis not present

## 2019-08-21 DIAGNOSIS — F4323 Adjustment disorder with mixed anxiety and depressed mood: Secondary | ICD-10-CM | POA: Diagnosis not present

## 2019-08-22 DIAGNOSIS — K5900 Constipation, unspecified: Secondary | ICD-10-CM | POA: Diagnosis not present

## 2019-08-23 DIAGNOSIS — M79602 Pain in left arm: Secondary | ICD-10-CM | POA: Diagnosis not present

## 2019-08-23 DIAGNOSIS — M542 Cervicalgia: Secondary | ICD-10-CM | POA: Diagnosis not present

## 2019-08-24 ENCOUNTER — Encounter (HOSPITAL_BASED_OUTPATIENT_CLINIC_OR_DEPARTMENT_OTHER): Payer: Self-pay | Admitting: Emergency Medicine

## 2019-08-24 ENCOUNTER — Emergency Department (HOSPITAL_BASED_OUTPATIENT_CLINIC_OR_DEPARTMENT_OTHER)
Admission: EM | Admit: 2019-08-24 | Discharge: 2019-08-24 | Disposition: A | Discharge status: Home / Self Care | Payer: Federal, State, Local not specified - PPO | Attending: Emergency Medicine | Admitting: Emergency Medicine

## 2019-08-24 ENCOUNTER — Other Ambulatory Visit: Payer: Self-pay

## 2019-08-24 DIAGNOSIS — Z7984 Long term (current) use of oral hypoglycemic drugs: Secondary | ICD-10-CM | POA: Diagnosis not present

## 2019-08-24 DIAGNOSIS — R519 Headache, unspecified: Secondary | ICD-10-CM

## 2019-08-24 DIAGNOSIS — J069 Acute upper respiratory infection, unspecified: Secondary | ICD-10-CM | POA: Insufficient documentation

## 2019-08-24 DIAGNOSIS — Z7982 Long term (current) use of aspirin: Secondary | ICD-10-CM | POA: Diagnosis not present

## 2019-08-24 DIAGNOSIS — Z20822 Contact with and (suspected) exposure to covid-19: Secondary | ICD-10-CM | POA: Insufficient documentation

## 2019-08-24 DIAGNOSIS — R05 Cough: Secondary | ICD-10-CM | POA: Diagnosis not present

## 2019-08-24 DIAGNOSIS — Z79899 Other long term (current) drug therapy: Secondary | ICD-10-CM | POA: Insufficient documentation

## 2019-08-24 DIAGNOSIS — B9789 Other viral agents as the cause of diseases classified elsewhere: Secondary | ICD-10-CM | POA: Diagnosis not present

## 2019-08-24 DIAGNOSIS — R0981 Nasal congestion: Secondary | ICD-10-CM | POA: Diagnosis not present

## 2019-08-24 DIAGNOSIS — E119 Type 2 diabetes mellitus without complications: Secondary | ICD-10-CM | POA: Insufficient documentation

## 2019-08-24 LAB — SARS CORONAVIRUS 2 (TAT 6-24 HRS): SARS Coronavirus 2: NEGATIVE

## 2019-08-24 MED ORDER — KETOROLAC TROMETHAMINE 30 MG/ML IJ SOLN
30.0000 mg | Freq: Once | INTRAMUSCULAR | Status: AC
  Administered 2019-08-24: 30 mg via INTRAMUSCULAR
  Filled 2019-08-24: qty 1

## 2019-08-24 NOTE — Discharge Instructions (Addendum)
Start taking 10mg  claritin daily. You may take mucinex for mucus.

## 2019-08-24 NOTE — ED Notes (Signed)
ED Provider at bedside. 

## 2019-08-24 NOTE — ED Provider Notes (Signed)
[facility] EMERGENCY DEPARTMENT Provider Note   CSN: [id number] Arrival date & time: 08/24/19  1007     History Chief Complaint  Patient presents with  . Headache    Lauren Caldwell is a 49 y.o. female.  49yo F 5 days ago, she began having scratchy, irritated throat. She then began having nasal congestion, cough, chest congestion, and headache. She has itchy eyes. Sx mainly worse at night and morning, better during the day. She had her 2nd COVID-19 vaccine almost 2 weeks ago and she had COVID-19 in Dec 2020. She has used inhaler a few times mainly to help with phlegm. No SOB, fevers, body aches, V/D. Twin sons who attend day program currently have runny noses.  She is going out of town next week and requests COVID testing.   The history is provided by the patient.  Headache      Past Medical History:  Diagnosis Date  . Diabetes mellitus without complication (HCC)   . Hypercholesteremia   . Migraine     Patient Active Problem List   Diagnosis Date Noted  . Palpitations 03/02/2016  . Migraine without aura and without status migrainosus, not intractable 12/03/2014  . OSA (obstructive sleep apnea) 12/03/2014    Past Surgical History:  Procedure Laterality Date  . BREAST BIOPSY Left 2004  . BREAST EXCISIONAL BIOPSY Left   . TUBAL LIGATION       OB History   No obstetric history on file.     Family History  Problem Relation Age of Onset  . Diabetes Mother   . Hypertension Mother   . Diabetes Brother   . Kidney failure Other   . Diabetes Maternal Grandmother   . Heart failure Maternal Grandfather     Social History   Tobacco Use  . Smoking status: Never Smoker  . Smokeless tobacco: Never Used  Substance Use Topics  . Alcohol use: Yes    Alcohol/week: 0.0 standard drinks    Comment: socially  . Drug use: No    Home Medications Prior to Admission medications   Medication Sig Start Date End Date Taking? Authorizing Provider  aspirin 81 MG  tablet Take 81 mg by mouth daily. Reported on 10/15/2015    [provider]  atorvastatin (LIPITOR) 20 MG tablet Take 20 mg by mouth daily.    [provider]  benzonatate (TESSALON) 200 MG capsule  09/28/15   [provider]  dapagliflozin propanediol (FARXIGA) 5 MG TABS tablet Take 5 mg by mouth daily.    [provider]  doxycycline (VIBRAMYCIN) 100 MG capsule  09/28/15   [provider]  fluticasone (FLONASE) 50 MCG/ACT nasal spray  10/14/15   [provider]  glimepiride (AMARYL) 4 MG tablet Take 4 mg by mouth daily before breakfast.    [provider]  losartan (COZAAR) 25 MG tablet  11/03/14   [provider]  metFORMIN (GLUCOPHAGE) 500 MG tablet Take 500 mg by mouth 2 (two) times daily with a meal.    [provider]  ondansetron (ZOFRAN) 4 MG tablet Take 1 tablet (4 mg total) by mouth every 6 (six) hours. 07/03/12   Jethro Bastos, NP  oxyCODONE-acetaminophen (PERCOCET/ROXICET) 5-325 MG per tablet Take 1 tablet by mouth every 4 (four) hours as needed for pain. 07/03/12   Jethro Bastos, NP  PROAIR HFA 108 [phone] BASE) MCG/ACT inhaler  08/29/14   [provider]    Allergies    Lipitor [atorvastatin]  Review of Systems   Review of Systems  Neurological: Positive for headaches.   All other systems reviewed and are negative except that which was mentioned in HPI  Physical Exam Updated Vital Signs BP 115/76 (BP Location: Right Arm)   Pulse 67   Temp 98.2 F (36.8 C) (Oral)   Resp 16   Ht 5\' 11"  (1.803 m)   Wt 90.7 kg   LMP 11/21/2013 Comment: NCP  SpO2 99%   BMI 27.89 kg/m   Physical Exam Vitals and nursing note reviewed.  Constitutional:      General: She is not in acute distress.    Appearance: She is well-developed.  HENT:     Head: Normocephalic and atraumatic.  Eyes:     Conjunctiva/sclera: Conjunctivae normal.  Cardiovascular:     Rate and Rhythm: Normal rate and regular rhythm.       Heart sounds: Normal heart sounds. No murmur.  Pulmonary:     Effort: Pulmonary effort is normal.     Breath sounds: Normal breath sounds.  Abdominal:     General: Bowel sounds are normal. There is no distension.     Palpations: Abdomen is soft.     Tenderness: There is no abdominal tenderness.  Musculoskeletal:     Cervical back: Neck supple.  Skin:    General: Skin is warm and dry.  Neurological:     Mental Status: She is alert and oriented to person, place, and time.     Comments: Fluent speech  Psychiatric:        Mood and Affect: Mood normal.        Judgment: Judgment normal.     ED Results / Procedures / Treatments   Labs (all labs ordered are listed, but only abnormal results are displayed) Labs Reviewed  SARS CORONAVIRUS 2 (TAT 6-24 HRS)    EKG None  Radiology No results found.  Procedures Procedures (including critical care time)  Medications Ordered in ED Medications  ketorolac (TORADOL) 30 MG/ML injection 30 mg (30 mg Intramuscular Given 08/24/19 1305)    ED Course  I have reviewed the triage vital signs and the nursing notes.     MDM Rules/Calculators/A&P                      Well-appearing and comfortable on exam with normal vital signs.  Clear breath sounds.  Symptoms are suggestive of viral URI versus seasonal allergies.  We will start the patient on daily Claritin.  Discussed supportive measures for her other symptoms.  She requested COVID-19 testing which is reasonable although I feel this is unlikely given her recent Covid infection and the fact that she has been vaccinated since then.  Discussed return precautions.  Lauren Caldwell was evaluated in Emergency Department on 08/24/2019 for the symptoms described in the history of present illness. She was evaluated in the context of the global COVID-19 pandemic, which necessitated consideration that the patient might be at risk for infection with the SARS-CoV-2 virus that causes COVID-19.  Institutional protocols and algorithms that pertain to the evaluation of patients at risk for COVID-19 are in a state of rapid change based on information released by regulatory bodies including the CDC and federal and state organizations. These policies and algorithms were followed during the patient's care in the ED.  Final Clinical Impression(s) / ED Diagnoses Final diagnoses:  Viral upper respiratory tract infection  Nonintractable headache, unspecified chronicity pattern, unspecified headache type    Rx /  DC Orders ED Discharge Orders    None       Leyna Vanderkolk, Wenda Overland, MD 08/24/19 1511

## 2019-08-24 NOTE — ED Triage Notes (Signed)
C/o headache, chest congestion, nasal congestion x 3 days.

## 2019-09-05 DIAGNOSIS — F4323 Adjustment disorder with mixed anxiety and depressed mood: Secondary | ICD-10-CM | POA: Diagnosis not present

## 2019-09-18 DIAGNOSIS — M542 Cervicalgia: Secondary | ICD-10-CM | POA: Diagnosis not present

## 2019-09-19 DIAGNOSIS — F4323 Adjustment disorder with mixed anxiety and depressed mood: Secondary | ICD-10-CM | POA: Diagnosis not present

## 2019-10-10 DIAGNOSIS — F4323 Adjustment disorder with mixed anxiety and depressed mood: Secondary | ICD-10-CM | POA: Diagnosis not present

## 2019-12-01 DIAGNOSIS — L03012 Cellulitis of left finger: Secondary | ICD-10-CM | POA: Diagnosis not present

## 2019-12-27 DIAGNOSIS — R809 Proteinuria, unspecified: Secondary | ICD-10-CM | POA: Diagnosis not present

## 2019-12-27 DIAGNOSIS — E7800 Pure hypercholesterolemia, unspecified: Secondary | ICD-10-CM | POA: Diagnosis not present

## 2019-12-27 DIAGNOSIS — E559 Vitamin D deficiency, unspecified: Secondary | ICD-10-CM | POA: Diagnosis not present

## 2019-12-27 DIAGNOSIS — Z0000 Encounter for general adult medical examination without abnormal findings: Secondary | ICD-10-CM | POA: Diagnosis not present

## 2019-12-27 DIAGNOSIS — E1169 Type 2 diabetes mellitus with other specified complication: Secondary | ICD-10-CM | POA: Diagnosis not present

## 2020-01-22 DIAGNOSIS — Z01419 Encounter for gynecological examination (general) (routine) without abnormal findings: Secondary | ICD-10-CM | POA: Diagnosis not present

## 2020-01-26 DIAGNOSIS — Z23 Encounter for immunization: Secondary | ICD-10-CM | POA: Diagnosis not present

## 2020-01-26 DIAGNOSIS — F430 Acute stress reaction: Secondary | ICD-10-CM | POA: Diagnosis not present

## 2020-01-26 DIAGNOSIS — G43909 Migraine, unspecified, not intractable, without status migrainosus: Secondary | ICD-10-CM | POA: Diagnosis not present

## 2020-01-26 DIAGNOSIS — G44209 Tension-type headache, unspecified, not intractable: Secondary | ICD-10-CM | POA: Diagnosis not present

## 2020-02-01 DIAGNOSIS — H04123 Dry eye syndrome of bilateral lacrimal glands: Secondary | ICD-10-CM | POA: Diagnosis not present

## 2020-02-05 ENCOUNTER — Encounter: Payer: Self-pay | Admitting: Neurology

## 2020-02-22 DIAGNOSIS — F439 Reaction to severe stress, unspecified: Secondary | ICD-10-CM | POA: Diagnosis not present

## 2020-02-22 DIAGNOSIS — R519 Headache, unspecified: Secondary | ICD-10-CM | POA: Diagnosis not present

## 2020-02-23 ENCOUNTER — Other Ambulatory Visit: Payer: Self-pay | Admitting: Physician Assistant

## 2020-02-23 DIAGNOSIS — R519 Headache, unspecified: Secondary | ICD-10-CM

## 2020-03-08 ENCOUNTER — Other Ambulatory Visit: Payer: Self-pay

## 2020-03-08 ENCOUNTER — Ambulatory Visit
Admission: RE | Admit: 2020-03-08 | Discharge: 2020-03-08 | Disposition: A | Discharge status: Home / Self Care | Payer: Federal, State, Local not specified - PPO | Source: Ambulatory Visit | Attending: Physician Assistant | Admitting: Physician Assistant

## 2020-03-08 DIAGNOSIS — R519 Headache, unspecified: Secondary | ICD-10-CM

## 2020-03-08 DIAGNOSIS — G43909 Migraine, unspecified, not intractable, without status migrainosus: Secondary | ICD-10-CM | POA: Diagnosis not present

## 2020-04-03 DIAGNOSIS — J069 Acute upper respiratory infection, unspecified: Secondary | ICD-10-CM | POA: Diagnosis not present

## 2020-04-24 NOTE — Progress Notes (Signed)
NEUROLOGY CONSULTATION NOTE  Lauren Caldwell MRN: [id number] DOB: 1970-07-11  Referring provider: Milus Height, PA Primary care provider: Milus Height, PA  Reason for consult:  headache   Subjective:  Lauren Caldwell is a 49 year old left-handed female with diabetes and HLD who presents for migraines.  History supplemented by referring provider's note.  She has history of migraines since young adulthood.  They are usually severe throbbing unilateral pain (left more than right) associated with nausea, vomiting and photophobia.  In 2016, she began having migraines associated with left sided arm and facial numbness.  Also with left sided neck pain. MRI of brain without contrast on 12/24/2014 personally reviewed showed asymmetric prominence of the right temporal lateral ventricle with no associated gliosis or infarction, likely of doubtful significance but otherwise unremarkable.  Migraines are now infrequent.  Over the past year, she reports a new headache.  It is a dull to severe sharp stabbing pain in the left temple, lasting no more than a couple of seconds.  No associated left sided facial numbness/weakness, ptosis, conjunctival injection, nasal congestion.  She reported some blurred vision.  She saw the eye doctor and was told she had dry eye.  They are occurring once or twice a day.  It doesn't really bother her as they are so brief.  She had COVID in December 2020 and wasn't sure if it could be related. CT head on 03/08/2020 personally reviewed was normal.  PAST MEDICAL HISTORY: Past Medical History:  Diagnosis Date  . Diabetes mellitus without complication (HCC)   . Hypercholesteremia   . Migraine     PAST SURGICAL HISTORY: Past Surgical History:  Procedure Laterality Date  . BREAST BIOPSY Left 2004  . BREAST EXCISIONAL BIOPSY Left   . TUBAL LIGATION      MEDICATIONS: Current Outpatient Medications on File Prior to Visit  Medication Sig Dispense Refill  . aspirin 81  MG tablet Take 81 mg by mouth daily. Reported on 10/15/2015    . atorvastatin (LIPITOR) 20 MG tablet Take 20 mg by mouth daily.    . benzonatate (TESSALON) 200 MG capsule     . dapagliflozin propanediol (FARXIGA) 5 MG TABS tablet Take 5 mg by mouth daily.    Marland Kitchen doxycycline (VIBRAMYCIN) 100 MG capsule     . fluticasone (FLONASE) 50 MCG/ACT nasal spray     . glimepiride (AMARYL) 4 MG tablet Take 4 mg by mouth daily before breakfast.    . losartan (COZAAR) 25 MG tablet     . metFORMIN (GLUCOPHAGE) 500 MG tablet Take 500 mg by mouth 2 (two) times daily with a meal.    . ondansetron (ZOFRAN) 4 MG tablet Take 1 tablet (4 mg total) by mouth every 6 (six) hours. 12 tablet 0  . oxyCODONE-acetaminophen (PERCOCET/ROXICET) 5-325 MG per tablet Take 1 tablet by mouth every 4 (four) hours as needed for pain. 10 tablet 0  . PROAIR HFA 108 (90 BASE) MCG/ACT inhaler      No current facility-administered medications on file prior to visit.    ALLERGIES: Allergies  Allergen Reactions  . Lipitor [Atorvastatin]     FAMILY HISTORY: Family History  Problem Relation Age of Onset  . Diabetes Mother   . Hypertension Mother   . Diabetes Brother   . Kidney failure Other   . Diabetes Maternal Grandmother   . Heart failure Maternal Grandfather     SOCIAL HISTORY: Social History   Socioeconomic History  . Marital status: Married  Spouse name: Not on file  . Number of children: Not on file  . Years of education: Not on file  . Highest education level: Not on file  Occupational History  . Not on file  Tobacco Use  . Smoking status: Never Smoker  . Smokeless tobacco: Never Used  Substance and Sexual Activity  . Alcohol use: Yes    Alcohol/week: 0.0 standard drinks    Comment: socially  . Drug use: No  . Sexual activity: Yes    Partners: Male    Birth control/protection: Surgical  Other Topics Concern  . Not on file  Social History Narrative  . Not on file   Social Determinants of Health    Financial Resource Strain: Not on file  Food Insecurity: Not on file  Transportation Needs: Not on file  Physical Activity: Not on file  Stress: Not on file  Social Connections: Not on file  Intimate Partner Violence: Not on file    Objective:  Blood pressure 117/77, pulse 78, height 5\' 6"  (1.676 m), weight 256 lb 9.6 oz (116.4 kg), last menstrual period 11/21/2013, SpO2 100 %.  General: No acute distress.  Patient appears well-groomed.   Head:  Normocephalic/atraumatic Eyes:  fundi examined but not visualized Neck: supple, no paraspinal tenderness, full range of motion Back: No paraspinal tenderness Heart: regular rate and rhythm Lungs: Clear to auscultation bilaterally. Vascular: No carotid bruits. Neurological Exam: Mental status: alert and oriented to person, place, and time, recent and remote memory intact, fund of knowledge intact, attention and concentration intact, speech fluent and not dysarthric, language intact. Cranial nerves: CN I: not tested CN II: pupils equal, round and reactive to light, visual fields intact CN III, IV, VI:  full range of motion, no nystagmus, no ptosis CN V: facial sensation intact. CN VII: upper and lower face symmetric CN VIII: hearing intact CN IX, X: gag intact, uvula midline CN XI: sternocleidomastoid and trapezius muscles intact CN XII: tongue midline Bulk & Tone: normal, no fasciculations. Motor:  muscle strength 5/5 throughout Sensation:  Pinprick, temperature and vibratory sensation intact. Deep Tendon Reflexes:  2+ throughout,  toes downgoing.   Finger to nose testing:  Without dysmetria.   Heel to shin:  Without dysmetria.   Gait:  Normal station and stride.  Romberg negative.  Assessment/Plan:   1.  Left temporal paroxysmal headache.  May be primary stabbing headache.  Not consistent with a trigeminal autonomic cephalgia or migraine.  Will investigate for secondary causes.  1.  Check sed rate  2.  Check CTA of head to  evaluate for cerebral aneurysm 3.  She states not requiring therapy. 4.  Further recommendations pending results.  Thank you for allowing me to take part in the care of this patient.  11/23/2013, DO  CC: [name], PA

## 2020-04-25 ENCOUNTER — Ambulatory Visit: Payer: Federal, State, Local not specified - PPO | Admitting: Neurology

## 2020-04-25 ENCOUNTER — Encounter: Payer: Self-pay | Admitting: Neurology

## 2020-04-25 ENCOUNTER — Other Ambulatory Visit: Payer: Self-pay

## 2020-04-25 VITALS — BP 117/77 | HR 78 | Ht 66.0 in | Wt 256.6 lb

## 2020-04-25 DIAGNOSIS — R519 Headache, unspecified: Secondary | ICD-10-CM

## 2020-04-25 NOTE — Patient Instructions (Signed)
1.  Check sed rate 2.  Check CTA of head 3.  Further recommendations pending results.

## 2020-04-29 ENCOUNTER — Encounter (INDEPENDENT_AMBULATORY_CARE_PROVIDER_SITE_OTHER): Payer: Self-pay | Admitting: Otolaryngology

## 2020-04-29 ENCOUNTER — Ambulatory Visit (INDEPENDENT_AMBULATORY_CARE_PROVIDER_SITE_OTHER): Payer: Federal, State, Local not specified - PPO | Admitting: Otolaryngology

## 2020-04-29 ENCOUNTER — Other Ambulatory Visit: Payer: Self-pay

## 2020-04-29 VITALS — Temp 97.7°F

## 2020-04-29 DIAGNOSIS — Z87898 Personal history of other specified conditions: Secondary | ICD-10-CM | POA: Diagnosis not present

## 2020-04-29 DIAGNOSIS — J310 Chronic rhinitis: Secondary | ICD-10-CM

## 2020-04-29 DIAGNOSIS — R040 Epistaxis: Secondary | ICD-10-CM

## 2020-04-29 NOTE — Progress Notes (Signed)
HPI: Lauren Caldwell is a 49 y.o. female who presents for evaluation of intermittent nasal congestion as well as history of recurrent headaches especially on the left side.  She is also described postnasal drainage with occasional bloody discharge.  She has had bleeding from the left side of her nose.  She also complains of a lot of phlegm in the nose which is worse at night.  The nosebleeds have always been on the left side.  She had a CT scan of her head performed 03/08/2020 and I reviewed this.  This showed clear paranasal sinuses and a clear nasal cavity. She has been diagnosed with migraine headaches.  She describes the headaches above the left eyebrow. 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  Past Medical History:  Diagnosis Date  . Diabetes mellitus without complication (HCC)   . Hypercholesteremia   . Migraine    Past Surgical History:  Procedure Laterality Date  . BREAST BIOPSY Left 2004  . BREAST EXCISIONAL BIOPSY Left   . TUBAL LIGATION     Social History   Socioeconomic History  . Marital status: Married    Spouse name: Not on file  . Number of children: Not on file  . Years of education: Not on file  . Highest education level: Not on file  Occupational History  . Not on file  Tobacco Use  . Smoking status: Never Smoker  . Smokeless tobacco: Never Used  Substance and Sexual Activity  . Alcohol use: Yes    Alcohol/week: 0.0 standard drinks    Comment: socially  . Drug use: No  . Sexual activity: Yes    Partners: Male    Birth control/protection: Surgical  Other Topics Concern  . Not on file  Social History Narrative  . Not on file   Social Determinants of Health   Financial Resource Strain: Not on file  Food Insecurity: Not on file  Transportation Needs: Not on file  Physical Activity: Not on file  Stress: Not on file  Social Connections: Not on file   Family History  Problem Relation Age of Onset  .  Diabetes Mother   . Hypertension Mother   . Diabetes Brother   . Kidney failure Other   . Diabetes Maternal Grandmother   . Heart failure Maternal Grandfather    Allergies  Allergen Reactions  . Lipitor [Atorvastatin]    Prior to Admission medications   Medication Sig Start Date End Date Taking? Authorizing Provider  aspirin 81 MG tablet Take 81 mg by mouth daily. Reported on 10/15/2015    [provider]  atorvastatin (LIPITOR) 20 MG tablet Take 20 mg by mouth daily.    [provider]  benzonatate (TESSALON) 200 MG capsule  09/28/15   [provider]  dapagliflozin propanediol (FARXIGA) 5 MG TABS tablet Take 5 mg by mouth daily.    [provider]  fluticasone Aleda Grana) 50 MCG/ACT nasal spray  10/14/15   [provider]  glimepiride (AMARYL) 4 MG tablet Take 4 mg by mouth daily before breakfast.    [provider]  losartan (COZAAR) 25 MG tablet  11/03/14   [provider]  metFORMIN (GLUCOPHAGE) 500 MG tablet Take 500 mg by mouth 2 (two) times daily with a meal.    [provider]  ondansetron (ZOFRAN) 4 MG tablet Take 1 tablet (4 mg total) by mouth every 6 (six) hours. 07/03/12   Remi Haggard  W, NP  oxyCODONE-acetaminophen (PERCOCET/ROXICET) 5-325 MG per tablet Take 1 tablet by mouth every 4 (four) hours as needed for pain. Patient not taking: Reported on 04/25/2020 07/03/12   Jethro Bastos, NP  [facility] HFA [phone] BASE) MCG/ACT inhaler  08/29/14   [provider]     Positive ROS: Otherwise negative  All other systems have been reviewed and were otherwise negative with the exception of those mentioned in the HPI and as above.  Physical Exam: Constitutional: Alert, well-appearing, no acute distress Ears: External ears without lesions or tenderness. Ear canals are clear bilaterally with intact, clear TMs bilaterally. Nasal: External nose without lesions. Septum is midline with mild rhinitis bilaterally.   After decongesting the nose nasal passages are clear otherwise with no polyps or intranasal masses noted.  Both middle meatus regions are clear..  I was able to identify site of bleeding from the left anterior inferior turbinate laterally within the nasal cavity.  This was cauterized using silver nitrate.  Remaining nasal passageway was clear. Oral: Lips and gums without lesions. Tongue and palate mucosa without lesions. Posterior oropharynx clear. Neck: No palpable adenopathy or masses Respiratory: Breathing comfortably  Skin: No facial/neck lesions or rash noted.  Control of epistaxis  Date/Time: 04/29/2020 6:18 PM Performed by: Drema Halon, MD Authorized by: Drema Halon, MD   Consent:    Consent obtained:  Verbal   Consent given by:  Patient   Risks discussed:  Bleeding and pain   Alternatives discussed:  No treatment and observation Anesthesia:    Anesthesia method:  None Procedure details:    Treatment site:  L anterior   Treatment method:  Silver nitrate   Treatment complexity:  Limited Post-procedure details:    Patient tolerance of procedure:  Tolerated well, no immediate complications Comments:     The bleeding site was the left anterior inferior turbinate.  This was cauterized using silver nitrate.    Assessment: Reviewed with the patient concerning the etiology of the nosebleed seems to be from the left anterior inferior turbinate and this was cauterized in the office. Sinuses are otherwise clear on review of the CT scan as well as clinical exam.  Plan: We cauterized the site or origin of her bleeding from the left anterior inferior turbinate.  Reviewed with her concerning use of cotton ball packing if she has any further bleeding from the nose.  She will follow-up as needed. Discussed with her that there is no evidence of sinus problems causing her headaches on review of the CT scan or on clinical exam.  Narda Bonds, MD

## 2020-05-07 ENCOUNTER — Ambulatory Visit
Admission: RE | Admit: 2020-05-07 | Discharge: 2020-05-07 | Disposition: A | Discharge status: Home / Self Care | Payer: Federal, State, Local not specified - PPO | Source: Ambulatory Visit | Attending: Neurology | Admitting: Neurology

## 2020-05-07 DIAGNOSIS — R519 Headache, unspecified: Secondary | ICD-10-CM

## 2020-05-08 ENCOUNTER — Telehealth: Payer: Self-pay

## 2020-05-08 DIAGNOSIS — R519 Headache, unspecified: Secondary | ICD-10-CM

## 2020-05-08 DIAGNOSIS — I671 Cerebral aneurysm, nonruptured: Secondary | ICD-10-CM

## 2020-05-08 NOTE — Telephone Encounter (Signed)
Pt advised of her CT results, as well as we have to order a CTA to evaluate the blood Vessels.   CTA scheduled for 1/10 at 1:30 pm pt to arrive at 1 pm.    Pt advised.

## 2020-05-08 NOTE — Telephone Encounter (Signed)
-----   Message from Drema Dallas, DO sent at 05/08/2020  6:55 AM EST ----- CT head is normal but I wanted to check a CTA of the head to evaluate the blood vessels.  Please enter order to schedule.

## 2020-05-20 ENCOUNTER — Ambulatory Visit (HOSPITAL_COMMUNITY)
Admission: RE | Admit: 2020-05-20 | Discharge: 2020-05-20 | Disposition: A | Discharge status: Home / Self Care | Payer: Federal, State, Local not specified - PPO | Source: Ambulatory Visit | Attending: Neurology | Admitting: Neurology

## 2020-05-20 ENCOUNTER — Other Ambulatory Visit: Payer: Self-pay

## 2020-05-20 ENCOUNTER — Encounter (HOSPITAL_COMMUNITY): Payer: Self-pay

## 2020-05-20 DIAGNOSIS — R519 Headache, unspecified: Secondary | ICD-10-CM

## 2020-05-20 DIAGNOSIS — I671 Cerebral aneurysm, nonruptured: Secondary | ICD-10-CM

## 2020-05-20 DIAGNOSIS — Q283 Other malformations of cerebral vessels: Secondary | ICD-10-CM | POA: Diagnosis not present

## 2020-05-20 DIAGNOSIS — Z8679 Personal history of other diseases of the circulatory system: Secondary | ICD-10-CM | POA: Diagnosis not present

## 2020-05-20 LAB — POCT I-STAT CREATININE: Creatinine, Ser: 0.7 mg/dL (ref 0.44–1.00)

## 2020-05-20 MED ORDER — IOHEXOL 350 MG/ML SOLN
80.0000 mL | Freq: Once | INTRAVENOUS | Status: AC | PRN
  Administered 2020-05-20: 80 mL via INTRAVENOUS

## 2020-05-21 ENCOUNTER — Telehealth: Payer: Self-pay

## 2020-05-21 NOTE — Progress Notes (Signed)
Pt advised of her CTA results. Pt c/o stabbing sharp pain in her that could last a couple of seconds. Pt states she drinks a lot coffee with alternative sugars

## 2020-05-21 NOTE — Telephone Encounter (Signed)
-----   Message from Drema Dallas, DO sent at 05/21/2020  3:20 PM EST ----- When I saw her in the office, she told me that she didn't want anything for the headache, that they were manageable and she just wanted to make sure there wasn't anything serious.  Is she now wanting something for the headache?  I would first recommend taking melatonin 3mg  at bedtime and she may increase by 3 mg every 2 weeks until 12mg  at bedtime.  If there is no improvement with melatonin at 12mg  at bedtime, then she should contact me and we can try something new.  Now that she wants to be treated for the headache, she should make a follow up appointment in 6 months. ----- Message ----- From: , CMA Sent: 05/21/2020  11:24 AM EST To: , DO  Pt advised of her CTA results. Pt c/o stabbing sharp pain in her that could last a couple of seconds. Pt states she drinks a lot coffee with alternative sugars

## 2020-05-21 NOTE — Telephone Encounter (Signed)
Tried calling pt, No answer. LMOVM 

## 2020-05-21 NOTE — Telephone Encounter (Signed)
Pt advised of Dr.Jaffe note below. Pt states right now she will manage without medication.   She thank DR.Jaffe for the advise if she needs to she will try Melatonin 3 mg.

## 2020-05-29 DIAGNOSIS — L7211 Pilar cyst: Secondary | ICD-10-CM | POA: Diagnosis not present

## 2020-06-03 ENCOUNTER — Other Ambulatory Visit: Payer: Self-pay | Admitting: Physician Assistant

## 2020-06-03 DIAGNOSIS — Z1231 Encounter for screening mammogram for malignant neoplasm of breast: Secondary | ICD-10-CM

## 2020-06-28 DIAGNOSIS — E1169 Type 2 diabetes mellitus with other specified complication: Secondary | ICD-10-CM | POA: Diagnosis not present

## 2020-06-28 DIAGNOSIS — E7800 Pure hypercholesterolemia, unspecified: Secondary | ICD-10-CM | POA: Diagnosis not present

## 2020-06-28 DIAGNOSIS — R809 Proteinuria, unspecified: Secondary | ICD-10-CM | POA: Diagnosis not present

## 2020-07-17 ENCOUNTER — Ambulatory Visit
Admission: RE | Admit: 2020-07-17 | Discharge: 2020-07-17 | Disposition: A | Discharge status: Home / Self Care | Payer: Federal, State, Local not specified - PPO | Source: Ambulatory Visit | Attending: Physician Assistant | Admitting: Physician Assistant

## 2020-07-17 ENCOUNTER — Other Ambulatory Visit: Payer: Self-pay

## 2020-07-17 DIAGNOSIS — Z1231 Encounter for screening mammogram for malignant neoplasm of breast: Secondary | ICD-10-CM | POA: Diagnosis not present

## 2020-07-18 DIAGNOSIS — G43909 Migraine, unspecified, not intractable, without status migrainosus: Secondary | ICD-10-CM | POA: Diagnosis not present

## 2020-07-22 DIAGNOSIS — Z713 Dietary counseling and surveillance: Secondary | ICD-10-CM | POA: Diagnosis not present

## 2020-08-07 DIAGNOSIS — Z713 Dietary counseling and surveillance: Secondary | ICD-10-CM | POA: Diagnosis not present

## 2020-08-27 DIAGNOSIS — Z713 Dietary counseling and surveillance: Secondary | ICD-10-CM | POA: Diagnosis not present

## 2020-10-01 DIAGNOSIS — Z03818 Encounter for observation for suspected exposure to other biological agents ruled out: Secondary | ICD-10-CM | POA: Diagnosis not present

## 2020-10-01 DIAGNOSIS — Z20822 Contact with and (suspected) exposure to covid-19: Secondary | ICD-10-CM | POA: Diagnosis not present

## 2020-10-09 DIAGNOSIS — Z713 Dietary counseling and surveillance: Secondary | ICD-10-CM | POA: Diagnosis not present

## 2020-10-29 DIAGNOSIS — Z20822 Contact with and (suspected) exposure to covid-19: Secondary | ICD-10-CM | POA: Diagnosis not present

## 2020-10-29 DIAGNOSIS — Z03818 Encounter for observation for suspected exposure to other biological agents ruled out: Secondary | ICD-10-CM | POA: Diagnosis not present

## 2020-11-25 NOTE — Progress Notes (Signed)
NEUROLOGY FOLLOW UP OFFICE NOTE  [name] [id number]  Assessment/Plan:   Primary stabbing headache - unclear if it was triggered by metformin.  Regardless, it has resolved.  If recurs, consider melatonin.  She will follow up as needed.  Subjective:  Lauren Caldwell is a 50 year old left-handed female with diabetes and HLD who follows up for migraines.  UPDATE: CTA of head on 05/20/2020 was personally reviewed and was unremarkable. She took herself off of metformin and B12 about 3 or 4 months ago, headaches resolved about 10 days later. She started exercising and losing weight.    HISTORY:  She has history of migraines since young adulthood.  They are usually severe throbbing unilateral pain (left more than right) associated with nausea, vomiting and photophobia.  In 2016, she began having migraines associated with left sided arm and facial numbness.  Also with left sided neck pain. MRI of brain without contrast on 12/24/2014  showed asymmetric prominence of the right temporal lateral ventricle with no associated gliosis or infarction, likely of doubtful significance but otherwise unremarkable.  Migraines are now infrequent.   In 2021, she reports a new headache.  It is a dull to severe sharp stabbing pain in the left temple, lasting no more than a couple of seconds.  No associated left sided facial numbness/weakness, ptosis, conjunctival injection, nasal congestion.  She reported some blurred vision.  She saw the eye doctor and was told she had dry eye.  They are occurring once or twice a day.  It doesn't really bother her as they are so brief.  She had COVID in December 2020 and wasn't sure if it could be related. CT head on 03/08/2020 was normal.  PAST MEDICAL HISTORY: Past Medical History:  Diagnosis Date   Diabetes mellitus without complication (HCC)    Hypercholesteremia    Migraine     MEDICATIONS: Current Outpatient Medications on File Prior to Visit  Medication Sig  Dispense Refill   aspirin 81 MG tablet Take 81 mg by mouth daily. Reported on 10/15/2015     atorvastatin (LIPITOR) 20 MG tablet Take 20 mg by mouth daily.     benzonatate (TESSALON) 200 MG capsule  (Patient not taking: Reported on 04/25/2020)     dapagliflozin propanediol (FARXIGA) 5 MG TABS tablet Take 5 mg by mouth daily.     fluticasone (FLONASE) 50 MCG/ACT nasal spray      glimepiride (AMARYL) 4 MG tablet Take 4 mg by mouth daily before breakfast.     losartan (COZAAR) 25 MG tablet      metFORMIN (GLUCOPHAGE) 500 MG tablet Take 500 mg by mouth 2 (two) times daily with a meal.     ondansetron (ZOFRAN) 4 MG tablet Take 1 tablet (4 mg total) by mouth every 6 (six) hours. 12 tablet 0   oxyCODONE-acetaminophen (PERCOCET/ROXICET) 5-325 MG per tablet Take 1 tablet by mouth every 4 (four) hours as needed for pain. (Patient not taking: Reported on 04/25/2020) 10 tablet 0   PROAIR HFA 108 (90 BASE) MCG/ACT inhaler      No current facility-administered medications on file prior to visit.    ALLERGIES: Allergies  Allergen Reactions   Lipitor [Atorvastatin]     FAMILY HISTORY: Family History  Problem Relation Age of Onset   Diabetes Mother    Hypertension Mother    Diabetes Brother    Kidney failure Other    Diabetes Maternal Grandmother    Heart failure Maternal Grandfather  Objective:  Blood pressure 126/81, pulse 84, height 5\' 6"  (1.676 m), weight 205 lb (93 kg), last menstrual period 11/21/2013, SpO2 98 %. General: No acute distress.  Patient appears well-groomed.     11/23/2013, DO  CC: [name], PA

## 2020-11-26 ENCOUNTER — Encounter: Payer: Self-pay | Admitting: Neurology

## 2020-11-26 ENCOUNTER — Ambulatory Visit: Payer: Federal, State, Local not specified - PPO | Admitting: Neurology

## 2020-11-26 ENCOUNTER — Other Ambulatory Visit: Payer: Self-pay

## 2020-11-26 VITALS — BP 126/81 | HR 84 | Ht 66.0 in | Wt 205.0 lb

## 2020-11-26 DIAGNOSIS — G4485 Primary stabbing headache: Secondary | ICD-10-CM

## 2020-11-28 DIAGNOSIS — Z20822 Contact with and (suspected) exposure to covid-19: Secondary | ICD-10-CM | POA: Diagnosis not present

## 2020-11-28 DIAGNOSIS — Z03818 Encounter for observation for suspected exposure to other biological agents ruled out: Secondary | ICD-10-CM | POA: Diagnosis not present

## 2020-12-12 DIAGNOSIS — J309 Allergic rhinitis, unspecified: Secondary | ICD-10-CM | POA: Diagnosis not present

## 2020-12-12 DIAGNOSIS — R053 Chronic cough: Secondary | ICD-10-CM | POA: Diagnosis not present

## 2020-12-12 DIAGNOSIS — U099 Post covid-19 condition, unspecified: Secondary | ICD-10-CM | POA: Diagnosis not present

## 2020-12-18 DIAGNOSIS — J208 Acute bronchitis due to other specified organisms: Secondary | ICD-10-CM | POA: Diagnosis not present

## 2020-12-18 DIAGNOSIS — F4321 Adjustment disorder with depressed mood: Secondary | ICD-10-CM | POA: Diagnosis not present

## 2021-01-10 DIAGNOSIS — Z20822 Contact with and (suspected) exposure to covid-19: Secondary | ICD-10-CM | POA: Diagnosis not present

## 2021-01-10 DIAGNOSIS — Z03818 Encounter for observation for suspected exposure to other biological agents ruled out: Secondary | ICD-10-CM | POA: Diagnosis not present

## 2021-01-15 DIAGNOSIS — R0789 Other chest pain: Secondary | ICD-10-CM | POA: Diagnosis not present

## 2021-01-15 DIAGNOSIS — K30 Functional dyspepsia: Secondary | ICD-10-CM | POA: Diagnosis not present

## 2021-01-31 DIAGNOSIS — Z0000 Encounter for general adult medical examination without abnormal findings: Secondary | ICD-10-CM | POA: Diagnosis not present

## 2021-01-31 DIAGNOSIS — R809 Proteinuria, unspecified: Secondary | ICD-10-CM | POA: Diagnosis not present

## 2021-01-31 DIAGNOSIS — E1169 Type 2 diabetes mellitus with other specified complication: Secondary | ICD-10-CM | POA: Diagnosis not present

## 2021-01-31 DIAGNOSIS — E7800 Pure hypercholesterolemia, unspecified: Secondary | ICD-10-CM | POA: Diagnosis not present

## 2021-01-31 DIAGNOSIS — Z23 Encounter for immunization: Secondary | ICD-10-CM | POA: Diagnosis not present

## 2021-01-31 DIAGNOSIS — K219 Gastro-esophageal reflux disease without esophagitis: Secondary | ICD-10-CM | POA: Diagnosis not present

## 2021-02-13 DIAGNOSIS — G43109 Migraine with aura, not intractable, without status migrainosus: Secondary | ICD-10-CM | POA: Diagnosis not present

## 2021-02-17 DIAGNOSIS — R220 Localized swelling, mass and lump, head: Secondary | ICD-10-CM | POA: Diagnosis not present

## 2021-02-17 DIAGNOSIS — H539 Unspecified visual disturbance: Secondary | ICD-10-CM | POA: Diagnosis not present

## 2021-02-17 DIAGNOSIS — L659 Nonscarring hair loss, unspecified: Secondary | ICD-10-CM | POA: Diagnosis not present

## 2021-02-25 ENCOUNTER — Telehealth: Payer: Self-pay | Admitting: Neurology

## 2021-02-25 DIAGNOSIS — L638 Other alopecia areata: Secondary | ICD-10-CM | POA: Diagnosis not present

## 2021-02-25 DIAGNOSIS — D170 Benign lipomatous neoplasm of skin and subcutaneous tissue of head, face and neck: Secondary | ICD-10-CM | POA: Diagnosis not present

## 2021-02-25 DIAGNOSIS — G43909 Migraine, unspecified, not intractable, without status migrainosus: Secondary | ICD-10-CM | POA: Diagnosis not present

## 2021-02-25 NOTE — Telephone Encounter (Signed)
LMOVM for pt to give [place] a call.

## 2021-02-25 NOTE — Telephone Encounter (Signed)
Patient returned call to Sheena. 

## 2021-02-25 NOTE — Telephone Encounter (Signed)
Pt advised of Dr.Jaffe note,It may be migraine as she has had prior migraines with facial numbness.  She should make an appointment to discuss further.   Pt transferred the front desk.

## 2021-02-25 NOTE — Telephone Encounter (Signed)
Pt called in stating she has been having some tingling in her face and is seeing lights in her peripheral vision. She would like a sooner appointment than 09/04/21, or some advice on what to do until then.

## 2021-02-26 NOTE — Telephone Encounter (Signed)
I see she has an appt in April, she wants to know if she can be fit in sooner? Wanted to make sure before changing date, as in an urgent? She said this is new things that is happening

## 2021-03-20 DIAGNOSIS — R1032 Left lower quadrant pain: Secondary | ICD-10-CM | POA: Diagnosis not present

## 2021-03-20 DIAGNOSIS — K5900 Constipation, unspecified: Secondary | ICD-10-CM | POA: Diagnosis not present

## 2021-03-21 DIAGNOSIS — N3941 Urge incontinence: Secondary | ICD-10-CM | POA: Diagnosis not present

## 2021-03-21 DIAGNOSIS — Z01419 Encounter for gynecological examination (general) (routine) without abnormal findings: Secondary | ICD-10-CM | POA: Diagnosis not present

## 2021-04-05 DIAGNOSIS — R6883 Chills (without fever): Secondary | ICD-10-CM | POA: Diagnosis not present

## 2021-04-05 DIAGNOSIS — Z03818 Encounter for observation for suspected exposure to other biological agents ruled out: Secondary | ICD-10-CM | POA: Diagnosis not present

## 2021-04-05 DIAGNOSIS — B029 Zoster without complications: Secondary | ICD-10-CM | POA: Diagnosis not present

## 2021-04-08 DIAGNOSIS — B029 Zoster without complications: Secondary | ICD-10-CM | POA: Diagnosis not present

## 2021-04-24 DIAGNOSIS — Z03818 Encounter for observation for suspected exposure to other biological agents ruled out: Secondary | ICD-10-CM | POA: Diagnosis not present

## 2021-04-24 DIAGNOSIS — R5383 Other fatigue: Secondary | ICD-10-CM | POA: Diagnosis not present

## 2021-04-24 DIAGNOSIS — E1169 Type 2 diabetes mellitus with other specified complication: Secondary | ICD-10-CM | POA: Diagnosis not present

## 2021-04-24 DIAGNOSIS — R197 Diarrhea, unspecified: Secondary | ICD-10-CM | POA: Diagnosis not present

## 2021-04-24 DIAGNOSIS — R52 Pain, unspecified: Secondary | ICD-10-CM | POA: Diagnosis not present

## 2021-04-29 DIAGNOSIS — J392 Other diseases of pharynx: Secondary | ICD-10-CM | POA: Diagnosis not present

## 2021-04-29 DIAGNOSIS — R0981 Nasal congestion: Secondary | ICD-10-CM | POA: Diagnosis not present

## 2021-04-29 DIAGNOSIS — R059 Cough, unspecified: Secondary | ICD-10-CM | POA: Diagnosis not present

## 2021-05-09 DIAGNOSIS — F411 Generalized anxiety disorder: Secondary | ICD-10-CM | POA: Diagnosis not present

## 2021-05-09 NOTE — Progress Notes (Signed)
NEUROLOGY FOLLOW UP OFFICE NOTE  Lauren Caldwell [id number]  Assessment/Plan:   New onset morning headaches - given associated excessive daytime sleepiness, consider sleep apnea but secondary intracranial etiology should be ruled out. Visual disturbance - eye exam reportedly unremarkable.  May be migraine aura although unusual for visual aura to just last seconds.  Secondary intracranial etiology should be ruled out. Left facial numbness - likely migraine - prior migraines were associated left sided facial/arm numbness and tingling. Excessive daytime sleepiness  Check MRI of brain w/wo contrast Refer to sleep medicine for evaluation of sleep apnea She would like to try non-pharmacologic management of headaches/migraines: Magnesium citrate 400mg  daily, riboflavin 400mg  daily, CoQ10 100mg  three times daily Routine exercise Proper sleep hygiene - evaluate for OSA Monitor diet Keep headache diary Limit use of pain relievers to no more than 2 days out of week to prevent risk of rebound or medication-overuse headache. Follow up 6 to 7 moths.    Subjective:  Lauren Caldwell is a 50 year old left-handed female with diabetes and HLD who follows up for numbness and tinglilng.   UPDATE: Back in October, she had tingling on the left side of her face occurring off and on for 3 to 4 days.  She may have had headache during that time.  It has since not recurred.  Over the past several months, she has had visual disturbance.  She describes a ball of light rolling in her peripheral vision, left more than right.  It lasts a few seconds but occurs anytime in the day but mostly when she gets up in the middle of the night to go to the bathroom.  Not associated with headache.  She had an eye exam which was reportedly normal.   She has had a new type of headache over the past several months.  She wakes up in the morning with it.  Location varies (mostly frontal but can be either side or back of head).   It is a severe pressure lasting 30-40 minutes with Advil and occurring 3 to 4 times a month.  No associated symptoms such as nausea, vomiting, photophobia, phonophobia, numbness or weakness.  She reports excessive daytime fatigue.       HISTORY:  She has history of migraines since young adulthood.  They are usually severe throbbing unilateral pain (left more than right) associated with nausea, vomiting and photophobia.  In 2016, she began having migraines associated with left sided arm and facial numbness.  Also with left sided neck pain. MRI of brain without contrast on 12/24/2014  showed asymmetric prominence of the right temporal lateral ventricle with no associated gliosis or infarction, likely of doubtful significance but otherwise unremarkable.  Migraines are now infrequent.   In 2021, she reports a new headache.  It is a dull to severe sharp stabbing pain in the left temple, lasting no more than a couple of seconds.  No associated left sided facial numbness/weakness, ptosis, conjunctival injection, nasal congestion.  She reported some blurred vision.  She saw the eye doctor and was told she had dry eye.  They are occurring once or twice a day.  It doesn't really bother her as they are so brief.  She had COVID in December 2020 and wasn't sure if it could be related. CT head on 03/08/2020 was normal.  CTA of head on 05/20/2020 was personally reviewed and was unremarkable.  She took herself off of metformin and B12, headaches resolved about 10 days later.  PAST MEDICAL HISTORY: Past Medical History:  Diagnosis Date   Diabetes mellitus without complication (HCC)    Hypercholesteremia    Migraine     MEDICATIONS: Current Outpatient Medications on File Prior to Visit  Medication Sig Dispense Refill   aspirin 81 MG tablet Take 81 mg by mouth daily. Reported on 10/15/2015 (Patient not taking: Reported on 11/26/2020)     atorvastatin (LIPITOR) 20 MG tablet Take 20 mg by mouth daily.      benzonatate (TESSALON) 200 MG capsule  (Patient not taking: No sig reported)     dapagliflozin propanediol (FARXIGA) 5 MG TABS tablet Take 5 mg by mouth daily.     fluticasone (FLONASE) 50 MCG/ACT nasal spray      glimepiride (AMARYL) 4 MG tablet Take 4 mg by mouth daily before breakfast.     losartan (COZAAR) 25 MG tablet      ondansetron (ZOFRAN) 4 MG tablet Take 1 tablet (4 mg total) by mouth every 6 (six) hours. (Patient not taking: Reported on 11/26/2020) 12 tablet 0   oxyCODONE-acetaminophen (PERCOCET/ROXICET) 5-325 MG per tablet Take 1 tablet by mouth every 4 (four) hours as needed for pain. (Patient not taking: No sig reported) 10 tablet 0   PROAIR HFA 108 (90 BASE) MCG/ACT inhaler      TRULICITY 0.75 MG/0.5ML SOPN Inject 0.75 mg into the skin once a week.     No current facility-administered medications on file prior to visit.    ALLERGIES: Allergies  Allergen Reactions   Lipitor [Atorvastatin]     FAMILY HISTORY: Family History  Problem Relation Age of Onset   Diabetes Mother    Hypertension Mother    Diabetes Brother    Kidney failure Other    Diabetes Maternal Grandmother    Heart failure Maternal Grandfather       Objective:  Blood pressure 115/78, pulse 82, height 5\' 11"  (1.803 m), weight 205 lb 12.8 oz (93.4 kg), last menstrual period 11/21/2013, SpO2 100 %. General: No acute distress.  Patient appears well-groomed.   Head:  Normocephalic/atraumatic Eyes:  Fundi examined but not visualized Neck: supple, no paraspinal tenderness, full range of motion Heart:  Regular rate and rhythm Lungs:  Clear to auscultation bilaterally Back: No paraspinal tenderness Neurological Exam: alert and oriented to person, place, and time.  Speech fluent and not dysarthric, language intact.  CN II-XII intact. Bulk and tone normal, muscle strength 5/5 throughout.  Sensation to light touch intact.  Deep tendon reflexes 2+ throughout, toes downgoing.  Finger to nose testing intact.  Gait  normal, Romberg negative.   11/23/2013, DO  CC: [name], PA-C

## 2021-05-13 ENCOUNTER — Ambulatory Visit: Payer: Federal, State, Local not specified - PPO | Admitting: Neurology

## 2021-05-13 ENCOUNTER — Encounter: Payer: Self-pay | Admitting: Neurology

## 2021-05-13 ENCOUNTER — Other Ambulatory Visit: Payer: Self-pay

## 2021-05-13 VITALS — BP 115/78 | HR 82 | Ht 71.0 in | Wt 205.8 lb

## 2021-05-13 DIAGNOSIS — G4719 Other hypersomnia: Secondary | ICD-10-CM | POA: Diagnosis not present

## 2021-05-13 DIAGNOSIS — H539 Unspecified visual disturbance: Secondary | ICD-10-CM | POA: Diagnosis not present

## 2021-05-13 DIAGNOSIS — R519 Headache, unspecified: Secondary | ICD-10-CM | POA: Diagnosis not present

## 2021-05-13 NOTE — Patient Instructions (Signed)
°  Check MRI of brain Refer to [facility] for evaluation of sleep apnea Start magnesium citrate 400mg  daily, riboflavin 400mg  daily, coenzyme Q10 100mg  three times daily. Limit use of pain relievers to no more than 2 days out of the week.  These medications include acetaminophen, NSAIDs (ibuprofen/Advil/Motrin, naproxen/Aleve, triptans (Imitrex/sumatriptan), Excedrin, and narcotics.  This will help reduce risk of rebound headaches. Be aware of common food triggers:  - Caffeine:  coffee, black tea, cola, Mt. Dew  - Chocolate  - Dairy:  aged cheeses (brie, blue, cheddar, gouda, [place], provolone, [place], Swiss, etc), chocolate milk, buttermilk, sour cream, limit eggs and yogurt  - Nuts, peanut butter  - Alcohol  - Cereals/grains:  FRESH breads (fresh bagels, sourdough, doughnuts), yeast productions  - Processed/canned/aged/cured meats (pre-packaged deli meats, hotdogs)  - MSG/glutamate:  soy sauce, flavor enhancer, pickled/preserved/marinated foods  - Sweeteners:  aspartame (Equal, Nutrasweet).  Sugar and Splenda are okay  - Vegetables:  legumes (lima beans, lentils, snow peas, fava beans, pinto peans, peas, garbanzo beans), sauerkraut, onions, olives, pickles  - Fruit:  avocados, bananas, citrus fruit (orange, lemon, grapefruit), mango  - Other:  Frozen meals, macaroni and cheese Routine exercise Stay adequately hydrated (aim for 64 oz water daily) Keep headache diary Maintain proper stress management Maintain proper sleep hygiene Do not skip meals

## 2021-05-15 ENCOUNTER — Other Ambulatory Visit: Payer: Self-pay | Admitting: Internal Medicine

## 2021-05-15 ENCOUNTER — Ambulatory Visit
Admission: RE | Admit: 2021-05-15 | Discharge: 2021-05-15 | Disposition: A | Discharge status: Home / Self Care | Payer: Federal, State, Local not specified - PPO | Source: Ambulatory Visit | Attending: Internal Medicine | Admitting: Internal Medicine

## 2021-05-15 DIAGNOSIS — R059 Cough, unspecified: Secondary | ICD-10-CM

## 2021-05-15 DIAGNOSIS — R0982 Postnasal drip: Secondary | ICD-10-CM | POA: Diagnosis not present

## 2021-05-15 DIAGNOSIS — R5383 Other fatigue: Secondary | ICD-10-CM | POA: Diagnosis not present

## 2021-05-15 DIAGNOSIS — Z23 Encounter for immunization: Secondary | ICD-10-CM | POA: Diagnosis not present

## 2021-05-20 DIAGNOSIS — F411 Generalized anxiety disorder: Secondary | ICD-10-CM | POA: Diagnosis not present

## 2021-05-22 ENCOUNTER — Ambulatory Visit
Admission: RE | Admit: 2021-05-22 | Discharge: 2021-05-22 | Disposition: A | Discharge status: Home / Self Care | Payer: Federal, State, Local not specified - PPO | Source: Ambulatory Visit | Attending: Neurology | Admitting: Neurology

## 2021-05-22 DIAGNOSIS — H539 Unspecified visual disturbance: Secondary | ICD-10-CM

## 2021-05-22 DIAGNOSIS — R519 Headache, unspecified: Secondary | ICD-10-CM | POA: Diagnosis not present

## 2021-05-22 MED ORDER — GADOBENATE DIMEGLUMINE 529 MG/ML IV SOLN
19.0000 mL | Freq: Once | INTRAVENOUS | Status: AC | PRN
  Administered 2021-05-22: 19 mL via INTRAVENOUS

## 2021-05-23 DIAGNOSIS — G43909 Migraine, unspecified, not intractable, without status migrainosus: Secondary | ICD-10-CM | POA: Diagnosis not present

## 2021-05-27 NOTE — Progress Notes (Signed)
Pt advised of her MRI.

## 2021-06-06 DIAGNOSIS — F411 Generalized anxiety disorder: Secondary | ICD-10-CM | POA: Diagnosis not present

## 2021-06-13 ENCOUNTER — Other Ambulatory Visit: Payer: Self-pay | Admitting: Physician Assistant

## 2021-06-13 DIAGNOSIS — Z1231 Encounter for screening mammogram for malignant neoplasm of breast: Secondary | ICD-10-CM

## 2021-06-16 ENCOUNTER — Institutional Professional Consult (permissible substitution): Payer: Federal, State, Local not specified - PPO | Admitting: Pulmonary Disease

## 2021-06-25 DIAGNOSIS — L299 Pruritus, unspecified: Secondary | ICD-10-CM | POA: Diagnosis not present

## 2021-07-09 DIAGNOSIS — F411 Generalized anxiety disorder: Secondary | ICD-10-CM | POA: Diagnosis not present

## 2021-07-18 ENCOUNTER — Other Ambulatory Visit: Payer: Self-pay

## 2021-07-18 ENCOUNTER — Ambulatory Visit (INDEPENDENT_AMBULATORY_CARE_PROVIDER_SITE_OTHER): Payer: Federal, State, Local not specified - PPO | Admitting: Pulmonary Disease

## 2021-07-18 ENCOUNTER — Encounter: Payer: Self-pay | Admitting: Pulmonary Disease

## 2021-07-18 VITALS — BP 116/72 | HR 79 | Temp 98.0°F | Ht 71.0 in | Wt 209.8 lb

## 2021-07-18 DIAGNOSIS — J3089 Other allergic rhinitis: Secondary | ICD-10-CM | POA: Diagnosis not present

## 2021-07-18 DIAGNOSIS — R0683 Snoring: Secondary | ICD-10-CM | POA: Diagnosis not present

## 2021-07-18 DIAGNOSIS — R5383 Other fatigue: Secondary | ICD-10-CM | POA: Diagnosis not present

## 2021-07-18 DIAGNOSIS — J351 Hypertrophy of tonsils: Secondary | ICD-10-CM

## 2021-07-18 MED ORDER — MONTELUKAST SODIUM 10 MG PO TABS: 10.0000 mg | ORAL_TABLET | Freq: Every day | ORAL | 5 refills | Status: DC

## 2021-07-18 MED ORDER — FLUTICASONE PROPIONATE 50 MCG/ACT NA SUSP: 1.0000 | Freq: Every day | NASAL | 5 refills | Status: AC

## 2021-07-18 MED ORDER — CETIRIZINE HCL 10 MG PO TABS: 10.0000 mg | ORAL_TABLET | Freq: Every day | ORAL | 5 refills | Status: AC

## 2021-07-18 MED ORDER — AZELASTINE HCL 0.1 % NA SOLN: 1.0000 | Freq: Two times a day (BID) | NASAL | 12 refills | Status: AC

## 2021-07-18 NOTE — Patient Instructions (Signed)
Azelastine (astepro) nasal spray 1 spray in each nostril in the morning and in the evening ?Fluticasone (flonase) nasal spray 1 spray in each nostril daily ?Cetirizine (zyrtec) 10 mg pill nightly ?Montelukast (singulair) 10 mg pill nightly ? ?Follow up in 4 to 5 weeks ?

## 2021-07-18 NOTE — Progress Notes (Signed)
? ?[facility] ? ?Chief Complaint  ?Patient presents with  ? Consult  ?  Consult.   ? ? ?Past Surgical History:  ?She  has a past surgical history that includes Tubal ligation; Breast biopsy (Left, 2004); and Breast excisional biopsy (Left). ? ?Past Medical History:  ?Diabetes, HLD, Migraine headaches, Hypertension ? ?Constitutional:  ?BP 116/72 (BP Location: Right Arm, Patient Position: Sitting, Cuff Size: Normal)   Pulse 79   Temp 98 ?F (36.7 ?C) (Oral)   Ht 5\' 11"  (1.803 m)   Wt 209 lb 12.8 oz (95.2 kg)   LMP 11/21/2013 Comment: NCP  SpO2 98%   BMI 29.26 kg/m?  ? ?Brief Summary:  ?Lauren Caldwell is a 51 y.o. female with fatigue. ?  ? ? ? ?Subjective:  ? ?She is followed by Dr. 44 with neurology for headaches.  She has daytime fatigue and would wake up with headaches.  She snores.  She used to stop breathing at night when she was heavier.  She lost about 30 lbs through diet modification and her sleep improved.  She does have allergies and gets drainage down her throat.  She feels he throat is swollen.   ? ?She goes to sleep at 10 pm.  She falls asleep after an hour.  She wakes up 2 or 3 times to use the bathroom.  She gets out of bed at 730 am.  She feels fatigue sometimes in the morning.  She denies morning headache.  She does not use anything to help her fall sleep.  She drinks coffee in the morning. ? ?She denies sleep walking, sleep talking, bruxism, or nightmares.  There is no history of restless legs.  She denies sleep hallucinations, sleep paralysis, or cataplexy. ? ?The Epworth score is 3 out of 24. ? ? ?Physical Exam:  ? ?Appearance - well kempt  ? ?ENMT - no sinus tenderness, no oral exudate, no LAN, Mallampati 3 airway, no stridor, 3+ tonsils ? ?Respiratory - equal breath sounds bilaterally, no wheezing or rales ? ?CV - s1s2 regular rate and rhythm, no murmurs ? ?Ext - no clubbing, no edema ? ?Skin - no rashes ? ?Psych - normal mood and affect ?   ?Sleep Tests:  ? ? ?Social History:  ?She  reports that she has never smoked. She has never used smokeless tobacco. She reports current alcohol use. She reports that she does not use drugs. ? ?Family History:  ?Her family history includes Diabetes in her brother, maternal grandmother, and mother; Heart failure in her maternal grandfather; Hypertension in her mother; Kidney failure in an other family member. ?  ? ?Discussion:  ?She has frequent headaches and daytime fatigue.  She has snoring and sleep disruption.  She used to have episodes of apnea, but these improved with weight loss.  She has persistent allergy symptoms which are likely contributing to tonsillary hypertrophy.  This in turn could contribute to some degree of sleep disordered breathing and headaches. ? ?Assessment/Plan:  ? ?Allergic rhinitis with tonsillary hypertrophy. ?- will have her use azelastine, flonase, zyrtec, and singulair on daily basis for the next few weeks and then reassess her symptoms and sleep pattern ? ?Daytime fatigue. ?- if her symptoms persist after more aggressive therapy of her allergies, then she will need a home sleep study to further assess for obstructive sleep apnea. ? ?Headaches. ?- followed by Dr. Everlena Cooper with neurology ? ?Time Spent Involved in Patient Care on Day of Examination:  ?51  minutes ? ?Follow up:  ? ?Patient Instructions  ?Azelastine (astepro) nasal spray 1 spray in each nostril in the morning and in the evening ?Fluticasone (flonase) nasal spray 1 spray in each nostril daily ?Cetirizine (zyrtec) 10 mg pill nightly ?Montelukast (singulair) 10 mg pill nightly ? ?Follow up in 4 to 5 weeks ? ?Medication List:  ? ?Allergies as of 07/18/2021   ? ?   Reactions  ? Lipitor [atorvastatin]   ? ?  ? ?  ?Medication List  ?  ? ?  ? Accurate as of July 18, 2021  4:58 PM. If you have any questions, ask your nurse or doctor.  ?  ?  ? ?  ? ?atorvastatin 20 MG tablet ?Commonly known as: LIPITOR ?Take 20 mg by mouth daily. ?   ?azelastine 0.1 % nasal spray ?Commonly known as: ASTELIN ?Place 1 spray into both nostrils 2 (two) times daily. Use in each nostril as directed ?Started by: Coralyn Helling, MD ?  ?cetirizine 10 MG tablet ?Commonly known as: ZYRTEC ?Take 1 tablet (10 mg total) by mouth daily. ?Started by: Coralyn Helling, MD ?  ?dapagliflozin propanediol 5 MG Tabs tablet ?Commonly known as: FARXIGA ?Take 5 mg by mouth daily. ?  ?fluticasone 50 MCG/ACT nasal spray ?Commonly known as: FLONASE ?Place 1 spray into both nostrils daily. ?What changed:  ?how much to take ?how to take this ?when to take this ?Changed by: Coralyn Helling, MD ?  ?glimepiride 4 MG tablet ?Commonly known as: AMARYL ?Take 4 mg by mouth daily before breakfast. ?  ?Linzess 72 MCG capsule ?Generic drug: linaclotide ?Take 72 mcg by mouth every morning. ?  ?losartan 25 MG tablet ?Commonly known as: COZAAR ?  ?montelukast 10 MG tablet ?Commonly known as: SINGULAIR ?Take 1 tablet (10 mg total) by mouth at bedtime. ?Started by: Coralyn Helling, MD ?  ?ProAir HFA 108 (90 Base) MCG/ACT inhaler ?Generic drug: albuterol ?  ?Trulicity 0.75 MG/0.5ML Sopn ?Generic drug: Dulaglutide ?Inject 0.75 mg into the skin once a week. ?  ?Vitamin D (Ergocalciferol) 1.25 MG (50000 UNIT) Caps capsule ?Commonly known as: DRISDOL ?Take 50,000 Units by mouth once a week. ?  ? ?  ? ? ?Signature:  ?Coralyn Helling, MD ?[facility] ?Pager - [phone] - [phone] ?07/18/2021, 4:58 PM ?  ? ? ? ? ? ? ? ? ?

## 2021-07-22 ENCOUNTER — Ambulatory Visit
Admission: RE | Admit: 2021-07-22 | Discharge: 2021-07-22 | Disposition: A | Discharge status: Home / Self Care | Payer: Federal, State, Local not specified - PPO | Source: Ambulatory Visit | Attending: Physician Assistant | Admitting: Physician Assistant

## 2021-07-22 ENCOUNTER — Other Ambulatory Visit: Payer: Self-pay

## 2021-07-22 DIAGNOSIS — Z1231 Encounter for screening mammogram for malignant neoplasm of breast: Secondary | ICD-10-CM | POA: Diagnosis not present

## 2021-07-23 DIAGNOSIS — F411 Generalized anxiety disorder: Secondary | ICD-10-CM | POA: Diagnosis not present

## 2021-08-06 DIAGNOSIS — F411 Generalized anxiety disorder: Secondary | ICD-10-CM | POA: Diagnosis not present

## 2021-08-07 DIAGNOSIS — G43909 Migraine, unspecified, not intractable, without status migrainosus: Secondary | ICD-10-CM | POA: Diagnosis not present

## 2021-08-07 DIAGNOSIS — E7800 Pure hypercholesterolemia, unspecified: Secondary | ICD-10-CM | POA: Diagnosis not present

## 2021-08-07 DIAGNOSIS — E1169 Type 2 diabetes mellitus with other specified complication: Secondary | ICD-10-CM | POA: Diagnosis not present

## 2021-08-07 DIAGNOSIS — Z23 Encounter for immunization: Secondary | ICD-10-CM | POA: Diagnosis not present

## 2021-08-07 DIAGNOSIS — J309 Allergic rhinitis, unspecified: Secondary | ICD-10-CM | POA: Diagnosis not present

## 2021-08-20 DIAGNOSIS — F331 Major depressive disorder, recurrent, moderate: Secondary | ICD-10-CM | POA: Diagnosis not present

## 2021-08-27 ENCOUNTER — Ambulatory Visit: Payer: Federal, State, Local not specified - PPO | Admitting: Pulmonary Disease

## 2021-09-03 DIAGNOSIS — F411 Generalized anxiety disorder: Secondary | ICD-10-CM | POA: Diagnosis not present

## 2021-09-04 ENCOUNTER — Ambulatory Visit: Payer: Federal, State, Local not specified - PPO | Admitting: Neurology

## 2021-09-17 DIAGNOSIS — F411 Generalized anxiety disorder: Secondary | ICD-10-CM | POA: Diagnosis not present

## 2021-09-18 DIAGNOSIS — L292 Pruritus vulvae: Secondary | ICD-10-CM | POA: Diagnosis not present

## 2021-10-18 DIAGNOSIS — R21 Rash and other nonspecific skin eruption: Secondary | ICD-10-CM | POA: Diagnosis not present

## 2021-10-20 DIAGNOSIS — M6790 Unspecified disorder of synovium and tendon, unspecified site: Secondary | ICD-10-CM | POA: Diagnosis not present

## 2021-10-20 DIAGNOSIS — L299 Pruritus, unspecified: Secondary | ICD-10-CM | POA: Diagnosis not present

## 2021-11-06 DIAGNOSIS — F411 Generalized anxiety disorder: Secondary | ICD-10-CM | POA: Diagnosis not present

## 2021-11-19 DIAGNOSIS — F411 Generalized anxiety disorder: Secondary | ICD-10-CM | POA: Diagnosis not present

## 2021-11-27 DIAGNOSIS — R0981 Nasal congestion: Secondary | ICD-10-CM | POA: Diagnosis not present

## 2021-11-27 DIAGNOSIS — U071 COVID-19: Secondary | ICD-10-CM | POA: Diagnosis not present

## 2021-11-27 DIAGNOSIS — R0989 Other specified symptoms and signs involving the circulatory and respiratory systems: Secondary | ICD-10-CM | POA: Diagnosis not present

## 2021-11-27 DIAGNOSIS — E1169 Type 2 diabetes mellitus with other specified complication: Secondary | ICD-10-CM | POA: Diagnosis not present

## 2021-12-02 DIAGNOSIS — F411 Generalized anxiety disorder: Secondary | ICD-10-CM | POA: Diagnosis not present

## 2021-12-11 ENCOUNTER — Ambulatory Visit: Payer: Federal, State, Local not specified - PPO | Admitting: Neurology

## 2021-12-15 DIAGNOSIS — R1032 Left lower quadrant pain: Secondary | ICD-10-CM | POA: Diagnosis not present

## 2021-12-16 DIAGNOSIS — F411 Generalized anxiety disorder: Secondary | ICD-10-CM | POA: Diagnosis not present

## 2021-12-23 ENCOUNTER — Ambulatory Visit: Payer: Federal, State, Local not specified - PPO | Admitting: Neurology

## 2021-12-30 DIAGNOSIS — F411 Generalized anxiety disorder: Secondary | ICD-10-CM | POA: Diagnosis not present

## 2022-01-15 ENCOUNTER — Other Ambulatory Visit: Payer: Self-pay | Admitting: Pulmonary Disease

## 2022-02-05 DIAGNOSIS — K219 Gastro-esophageal reflux disease without esophagitis: Secondary | ICD-10-CM | POA: Diagnosis not present

## 2022-02-05 DIAGNOSIS — Z23 Encounter for immunization: Secondary | ICD-10-CM | POA: Diagnosis not present

## 2022-02-05 DIAGNOSIS — G43909 Migraine, unspecified, not intractable, without status migrainosus: Secondary | ICD-10-CM | POA: Diagnosis not present

## 2022-02-05 DIAGNOSIS — Z0000 Encounter for general adult medical examination without abnormal findings: Secondary | ICD-10-CM | POA: Diagnosis not present

## 2022-02-05 DIAGNOSIS — E1169 Type 2 diabetes mellitus with other specified complication: Secondary | ICD-10-CM | POA: Diagnosis not present

## 2022-02-05 DIAGNOSIS — E7800 Pure hypercholesterolemia, unspecified: Secondary | ICD-10-CM | POA: Diagnosis not present

## 2022-02-26 DIAGNOSIS — F411 Generalized anxiety disorder: Secondary | ICD-10-CM | POA: Diagnosis not present

## 2022-03-09 DIAGNOSIS — L308 Other specified dermatitis: Secondary | ICD-10-CM | POA: Diagnosis not present

## 2022-03-12 DIAGNOSIS — F411 Generalized anxiety disorder: Secondary | ICD-10-CM | POA: Diagnosis not present

## 2022-03-24 DIAGNOSIS — L282 Other prurigo: Secondary | ICD-10-CM | POA: Diagnosis not present

## 2022-04-08 DIAGNOSIS — K5904 Chronic idiopathic constipation: Secondary | ICD-10-CM | POA: Diagnosis not present

## 2022-04-09 DIAGNOSIS — F411 Generalized anxiety disorder: Secondary | ICD-10-CM | POA: Diagnosis not present

## 2022-05-07 DIAGNOSIS — F411 Generalized anxiety disorder: Secondary | ICD-10-CM | POA: Diagnosis not present

## 2022-05-21 DIAGNOSIS — F411 Generalized anxiety disorder: Secondary | ICD-10-CM | POA: Diagnosis not present

## 2022-05-21 DIAGNOSIS — E7800 Pure hypercholesterolemia, unspecified: Secondary | ICD-10-CM | POA: Diagnosis not present

## 2022-07-02 DIAGNOSIS — F411 Generalized anxiety disorder: Secondary | ICD-10-CM | POA: Diagnosis not present

## 2022-07-06 ENCOUNTER — Other Ambulatory Visit: Payer: Self-pay | Admitting: Physician Assistant

## 2022-07-06 DIAGNOSIS — Z1231 Encounter for screening mammogram for malignant neoplasm of breast: Secondary | ICD-10-CM

## 2022-07-16 DIAGNOSIS — F411 Generalized anxiety disorder: Secondary | ICD-10-CM | POA: Diagnosis not present

## 2022-07-30 DIAGNOSIS — F411 Generalized anxiety disorder: Secondary | ICD-10-CM | POA: Diagnosis not present

## 2022-08-06 DIAGNOSIS — E7800 Pure hypercholesterolemia, unspecified: Secondary | ICD-10-CM | POA: Diagnosis not present

## 2022-08-06 DIAGNOSIS — E1169 Type 2 diabetes mellitus with other specified complication: Secondary | ICD-10-CM | POA: Diagnosis not present

## 2022-08-06 DIAGNOSIS — G43909 Migraine, unspecified, not intractable, without status migrainosus: Secondary | ICD-10-CM | POA: Diagnosis not present

## 2022-08-06 DIAGNOSIS — K219 Gastro-esophageal reflux disease without esophagitis: Secondary | ICD-10-CM | POA: Diagnosis not present

## 2022-08-13 DIAGNOSIS — F411 Generalized anxiety disorder: Secondary | ICD-10-CM | POA: Diagnosis not present

## 2022-08-24 ENCOUNTER — Ambulatory Visit
Admission: RE | Admit: 2022-08-24 | Discharge: 2022-08-24 | Disposition: A | Discharge status: Home / Self Care | Payer: Federal, State, Local not specified - PPO | Source: Ambulatory Visit | Attending: Physician Assistant | Admitting: Physician Assistant

## 2022-08-24 DIAGNOSIS — Z1231 Encounter for screening mammogram for malignant neoplasm of breast: Secondary | ICD-10-CM | POA: Diagnosis not present

## 2022-08-27 DIAGNOSIS — F411 Generalized anxiety disorder: Secondary | ICD-10-CM | POA: Diagnosis not present

## 2022-09-24 DIAGNOSIS — F411 Generalized anxiety disorder: Secondary | ICD-10-CM | POA: Diagnosis not present

## 2022-10-08 DIAGNOSIS — F411 Generalized anxiety disorder: Secondary | ICD-10-CM | POA: Diagnosis not present

## 2022-11-02 DIAGNOSIS — M549 Dorsalgia, unspecified: Secondary | ICD-10-CM | POA: Diagnosis not present

## 2022-11-02 DIAGNOSIS — E7800 Pure hypercholesterolemia, unspecified: Secondary | ICD-10-CM | POA: Diagnosis not present

## 2022-11-02 DIAGNOSIS — Z5181 Encounter for therapeutic drug level monitoring: Secondary | ICD-10-CM | POA: Diagnosis not present

## 2022-12-28 DIAGNOSIS — M6740 Ganglion, unspecified site: Secondary | ICD-10-CM | POA: Diagnosis not present

## 2022-12-31 DIAGNOSIS — M67449 Ganglion, unspecified hand: Secondary | ICD-10-CM | POA: Diagnosis not present

## 2023-01-04 DIAGNOSIS — K5904 Chronic idiopathic constipation: Secondary | ICD-10-CM | POA: Diagnosis not present

## 2023-02-08 DIAGNOSIS — Z5181 Encounter for therapeutic drug level monitoring: Secondary | ICD-10-CM | POA: Diagnosis not present

## 2023-02-08 DIAGNOSIS — Z23 Encounter for immunization: Secondary | ICD-10-CM | POA: Diagnosis not present

## 2023-02-08 DIAGNOSIS — E1169 Type 2 diabetes mellitus with other specified complication: Secondary | ICD-10-CM | POA: Diagnosis not present

## 2023-02-08 DIAGNOSIS — E7800 Pure hypercholesterolemia, unspecified: Secondary | ICD-10-CM | POA: Diagnosis not present

## 2023-02-17 DIAGNOSIS — Z23 Encounter for immunization: Secondary | ICD-10-CM | POA: Diagnosis not present

## 2023-02-17 DIAGNOSIS — J309 Allergic rhinitis, unspecified: Secondary | ICD-10-CM | POA: Diagnosis not present

## 2023-02-17 DIAGNOSIS — R809 Proteinuria, unspecified: Secondary | ICD-10-CM | POA: Diagnosis not present

## 2023-02-17 DIAGNOSIS — E1169 Type 2 diabetes mellitus with other specified complication: Secondary | ICD-10-CM | POA: Diagnosis not present

## 2023-02-17 DIAGNOSIS — E7800 Pure hypercholesterolemia, unspecified: Secondary | ICD-10-CM | POA: Diagnosis not present

## 2023-02-17 DIAGNOSIS — Z0000 Encounter for general adult medical examination without abnormal findings: Secondary | ICD-10-CM | POA: Diagnosis not present

## 2023-04-10 ENCOUNTER — Ambulatory Visit
Admission: EM | Admit: 2023-04-10 | Discharge: 2023-04-10 | Disposition: A | Discharge status: Home / Self Care | Payer: Federal, State, Local not specified - PPO

## 2023-04-10 ENCOUNTER — Telehealth: Payer: Self-pay

## 2023-04-10 DIAGNOSIS — J208 Acute bronchitis due to other specified organisms: Secondary | ICD-10-CM

## 2023-04-10 DIAGNOSIS — J4521 Mild intermittent asthma with (acute) exacerbation: Secondary | ICD-10-CM

## 2023-04-10 DIAGNOSIS — R051 Acute cough: Secondary | ICD-10-CM

## 2023-04-10 MED ORDER — AEROCHAMBER PLUS FLO-VU LARGE MISC
1.0000 | Freq: Once | Status: AC
  Administered 2023-04-10: 1

## 2023-04-10 MED ORDER — PROMETHAZINE-DM 6.25-15 MG/5ML PO SYRP: 5.0000 mL | ORAL_SOLUTION | Freq: Four times a day (QID) | ORAL | 0 refills | Status: AC | PRN

## 2023-04-10 MED ORDER — ALBUTEROL SULFATE HFA 108 (90 BASE) MCG/ACT IN AERS
2.0000 | INHALATION_SPRAY | Freq: Once | RESPIRATORY_TRACT | Status: AC
  Administered 2023-04-10: 2 via RESPIRATORY_TRACT

## 2023-04-10 MED ORDER — BUDESONIDE-FORMOTEROL FUMARATE 160-4.5 MCG/ACT IN AERO: 1.0000 | INHALATION_SPRAY | Freq: Two times a day (BID) | RESPIRATORY_TRACT | 0 refills | Status: AC

## 2023-04-10 MED ORDER — PROMETHAZINE-DM 6.25-15 MG/5ML PO SYRP: 5.0000 mL | ORAL_SOLUTION | Freq: Four times a day (QID) | ORAL | 0 refills | Status: DC | PRN

## 2023-04-10 NOTE — ED Provider Notes (Signed)
Renaldo Fiddler    CSN: [id number] Arrival date & time: 04/10/23  4034      History   Chief Complaint Chief Complaint  Patient presents with   Cough    HPI Lauren Caldwell is a 52 y.o. female.   Patient presents today with a 6+ day history of URI symptoms.  She reports nasal congestion, sinus pressure, cough.  She reports that the coughing fits are extreme and cause her to have tightness and discomfort in her chest.  She denies any ongoing chest pain or shortness of breath.  Denies any fever, nausea, vomiting.  She has been taking over-the-counter medications including DayQuil/NyQuil, Mucinex, hot tea and honey with minimal improvement of symptoms.  She does have seasonal allergies but denies any history of asthma, COPD, smoking.  Does report that she has used an inhaler in the past but does not have this available because it expired.  She denies hospitalization related to asthma.  She does have a history of diabetes but reports her blood sugars are adequately controlled.  Denies any recent antibiotics or steroids.  She is having difficulty sleeping at night as a result of the symptoms.    Past Medical History:  Diagnosis Date   Diabetes mellitus without complication (HCC)    Hypercholesteremia    Migraine     Patient Active Problem List   Diagnosis Date Noted   Palpitations 03/02/2016   Migraine without aura and without status migrainosus, not intractable 12/03/2014   OSA (obstructive sleep apnea) 12/03/2014    Past Surgical History:  Procedure Laterality Date   BREAST BIOPSY Left 2004   BREAST EXCISIONAL BIOPSY Left    TUBAL LIGATION      OB History   No obstetric history on file.      Home Medications    Prior to Admission medications   Medication Sig Start Date End Date Taking? Authorizing Provider  atorvastatin (LIPITOR) 20 MG tablet Take 20 mg by mouth daily.   Yes [provider]  azelastine (ASTELIN) 0.1 % nasal spray Place 1 spray into  both nostrils 2 (two) times daily. Use in each nostril as directed 07/18/21  Yes Coralyn Helling, MD  budesonide-formoterol (SYMBICORT) 160-4.5 MCG/ACT inhaler Inhale 1 puff into the lungs in the morning and at bedtime. 04/10/23  Yes Samamtha Tiegs K, PA-C  cetirizine (ZYRTEC) 10 MG tablet Take 1 tablet (10 mg total) by mouth daily. 07/18/21  Yes Coralyn Helling, MD  dapagliflozin propanediol (FARXIGA) 5 MG TABS tablet Take 5 mg by mouth daily.   Yes [provider]  fluticasone (FLONASE) 50 MCG/ACT nasal spray Place 1 spray into both nostrils daily. 07/18/21  Yes Coralyn Helling, MD  glimepiride (AMARYL) 4 MG tablet Take 4 mg by mouth daily before breakfast.   Yes [provider]  losartan (COZAAR) 25 MG tablet  11/03/14  Yes [provider]  montelukast (SINGULAIR) 10 MG tablet TAKE 1 TABLET BY MOUTH EVERYDAY AT BEDTIME 01/15/22  Yes Coralyn Helling, MD  promethazine-dextromethorphan (PROMETHAZINE-DM) 6.25-15 MG/5ML syrup Take 5 mLs by mouth 4 (four) times daily as needed for cough. 04/10/23  Yes Makynli Stills K, PA-C  TRULICITY 0.75 MG/0.5ML SOPN Inject 0.75 mg into the skin once a week. 09/30/20  Yes [provider]  Vitamin D, Ergocalciferol, (DRISDOL) 1.25 MG (50000 UNIT) CAPS capsule Take 50,000 Units by mouth once a week. 03/23/21  Yes [provider]  diclofenac (VOLTAREN) 50 MG EC tablet Take 50 mg by mouth 2 (two)  times daily. 11/02/22   [provider]  LINZESS 72 MCG capsule Take 72 mcg by mouth every morning. 03/20/21   [provider]  PROAIR HFA 108 (90 BASE) MCG/ACT inhaler Inhale 2 puffs into the lungs every 4 (four) hours as needed for wheezing or shortness of breath. Expired. Needs new one. 08/29/14   [provider]  triamcinolone cream (KENALOG) 0.1 % Apply 1 Application topically 2 (two) times daily. 06/25/21   [provider]    Family History Family History  Problem Relation Age of Onset   Diabetes Mother     Hypertension Mother    Diabetes Brother    Diabetes Maternal Grandmother    Heart failure Maternal Grandfather    Kidney failure Other     Social History Social History   Tobacco Use   Smoking status: Never    Passive exposure: Never   Smokeless tobacco: Never  Vaping Use   Vaping status: Never Used  Substance Use Topics   Alcohol use: Yes    Alcohol/week: 0.0 standard drinks of alcohol    Comment: socially   Drug use: No     Allergies   Lipitor [atorvastatin], Lisinopril, and Metformin   Review of Systems Review of Systems  Constitutional:  Positive for activity change. Negative for appetite change, fatigue and fever.  HENT:  Positive for congestion, postnasal drip and sinus pressure. Negative for sneezing and sore throat.   Respiratory:  Positive for cough and chest tightness. Negative for shortness of breath and wheezing.   Cardiovascular:  Negative for chest pain.  Gastrointestinal:  Negative for abdominal pain, diarrhea, nausea and vomiting.  Neurological:  Negative for dizziness, light-headedness and headaches.     Physical Exam Triage Vital Signs ED Triage Vitals  Encounter Vitals Group     BP 04/10/23 0827 121/69     Systolic BP Percentile --      Diastolic BP Percentile --      Pulse Rate 04/10/23 0827 84     Resp 04/10/23 0827 18     Temp 04/10/23 0827 99.3 F (37.4 C)     Temp Source 04/10/23 0827 Oral     SpO2 04/10/23 0827 96 %     Weight 04/10/23 0821 215 lb (97.5 kg)     Height 04/10/23 0821 5\' 11"  (1.803 m)     Head Circumference --      Peak Flow --      Pain Score 04/10/23 0821 0     Pain Loc --      Pain Education --      Exclude from Growth Chart --    No data found.  Updated Vital Signs BP 121/69 (BP Location: Right Arm)   Pulse 84   Temp 99.3 F (37.4 C) (Oral)   Resp 18   Ht 5\' 11"  (1.803 m)   Wt 215 lb (97.5 kg)   LMP 11/21/2013 Comment: NCP  SpO2 96%   BMI 29.99 kg/m   Visual Acuity Right Eye Distance:   Left Eye  Distance:   Bilateral Distance:    Right Eye Near:   Left Eye Near:    Bilateral Near:     Physical Exam Vitals reviewed.  Constitutional:      General: She is awake. She is not in acute distress.    Appearance: Normal appearance. She is well-developed. She is not ill-appearing.     Comments: Very pleasant female appears stated age in no acute distress sitting comfortably in exam  room  HENT:     Head: Normocephalic and atraumatic.     Right Ear: Tympanic membrane, ear canal and external ear normal. Tympanic membrane is not erythematous or bulging.     Left Ear: Tympanic membrane, ear canal and external ear normal. Tympanic membrane is not erythematous or bulging.     Nose:     Right Sinus: Maxillary sinus tenderness present. No frontal sinus tenderness.     Left Sinus: Maxillary sinus tenderness present. No frontal sinus tenderness.     Mouth/Throat:     Pharynx: Uvula midline. No oropharyngeal exudate or posterior oropharyngeal erythema.  Cardiovascular:     Rate and Rhythm: Normal rate and regular rhythm.     Heart sounds: Normal heart sounds, S1 normal and S2 normal. No murmur heard. Pulmonary:     Effort: Pulmonary effort is normal.     Breath sounds: Examination of the right-lower field reveals decreased breath sounds. Examination of the left-lower field reveals decreased breath sounds. Decreased breath sounds present. No wheezing, rhonchi or rales.  Psychiatric:        Behavior: Behavior is cooperative.      UC Treatments / Results  Labs (all labs ordered are listed, but only abnormal results are displayed) Labs Reviewed - No data to display  EKG   Radiology No results found.  Procedures Procedures (including critical care time)  Medications Ordered in UC Medications  albuterol (VENTOLIN HFA) 108 (90 Base) MCG/ACT inhaler 2 puff (2 puffs Inhalation Given 04/10/23 0848)  AeroChamber Plus Flo-Vu Large MISC 1 each (1 each Other Given 04/10/23 0848)    Initial  Impression / Assessment and Plan / UC Course  I have reviewed the triage vital signs and the nursing notes.  Pertinent labs & imaging results that were available during my care of the patient were reviewed by me and considered in my medical decision making (see chart for details).     Patient is well-appearing, afebrile, nontoxic, nontachycardic.  No indication for viral testing as she has been symptomatic for more than 5 days and this would not change management.  No evidence of acute infection on physical exam that would warrant initiation of antibiotics.  Suspect viral bronchitis that has triggered asthma symptoms.  She was given albuterol in clinic with improvement of symptoms and sent home with this medication to be used every 4-6 hours as needed.  She does have a history of diabetes but reports her blood sugars are appropriately controlled.  Will use inhaled corticosteroid as part of Symbicort to help manage breathing symptoms in the hopes that this would not impact her blood sugar as much.  We discussed that she should monitor her blood sugar and limit carbohydrates while pushing fluids.  She was given Promethazine DM for cough.  Discussed that this can be sedating and she is not to drive drink alcohol with taking it.  Chest x-ray was deferred as oxygen saturation was 96% and she had no adventitious lung sounds following albuterol dose on exam.  We discussed that if her symptoms are not improving within 3 to 5 days or if anything worsens she needs to be seen immediately.  Strict return precautions given.  Work excuse note provided.  Final Clinical Impressions(s) / UC Diagnoses   Final diagnoses:  Viral bronchitis  Acute cough  Mild intermittent asthma with acute exacerbation     Discharge Instructions      I believe that you have a viral bronchitis.  Use Promethazine DM up to  4 times a day for cough.  This will make you sleepy so do not drive drink alcohol taking it.  Continue the  albuterol every 4-6 hours as needed for shortness of breath and coughing fits.  Start Symbicort twice daily.  Rinse your mouth following use of this medication to prevent thrush.  This can raise your blood sugars a little bit so try to limit carbohydrates and drink plenty of fluid.  Monitor blood sugar closely.  If your symptoms are not improving within 5 days please return.  If anything worsens you have worsening cough, shortness of breath, high fever, weakness you need to be seen immediately.     ED Prescriptions     Medication Sig Dispense Auth. Provider   promethazine-dextromethorphan (PROMETHAZINE-DM) 6.25-15 MG/5ML syrup Take 5 mLs by mouth 4 (four) times daily as needed for cough. 118 mL Misty Foutz K, PA-C   budesonide-formoterol (SYMBICORT) 160-4.5 MCG/ACT inhaler Inhale 1 puff into the lungs in the morning and at bedtime. 1 each Yavuz Kirby, Noberto Retort, PA-C      PDMP not reviewed this encounter.   Jeani Hawking, PA-C 04/10/23 1610

## 2023-04-10 NOTE — ED Triage Notes (Signed)
"  I am having this Cough that will not quit". "I am having spells where it won't stop". Started "about 5 days ago, around Tuesday". Some runny nose "that has gotten better". No fever. No history of Asthma or COPD, ? Wheezing earlier this week.

## 2023-04-10 NOTE — Discharge Instructions (Signed)
I believe that you have a viral bronchitis.  Use Promethazine DM up to 4 times a day for cough.  This will make you sleepy so do not drive drink alcohol taking it.  Continue the albuterol every 4-6 hours as needed for shortness of breath and coughing fits.  Start Symbicort twice daily.  Rinse your mouth following use of this medication to prevent thrush.  This can raise your blood sugars a little bit so try to limit carbohydrates and drink plenty of fluid.  Monitor blood sugar closely.  If your symptoms are not improving within 5 days please return.  If anything worsens you have worsening cough, shortness of breath, high fever, weakness you need to be seen immediately.

## 2023-04-10 NOTE — Telephone Encounter (Signed)
Pt called stating she was unable to pick up her prescription of promethazine DM, [facility] told her they did not have that prescription in their system for her. Re-sent prescription to [facility] in [place].

## 2023-07-22 ENCOUNTER — Other Ambulatory Visit: Payer: Self-pay | Admitting: Physician Assistant

## 2023-07-22 DIAGNOSIS — Z1231 Encounter for screening mammogram for malignant neoplasm of breast: Secondary | ICD-10-CM

## 2023-08-09 ENCOUNTER — Emergency Department

## 2023-08-09 ENCOUNTER — Other Ambulatory Visit: Payer: Self-pay

## 2023-08-09 ENCOUNTER — Emergency Department
Admission: EM | Admit: 2023-08-09 | Discharge: 2023-08-09 | Disposition: A | Discharge status: Home / Self Care | Attending: Emergency Medicine | Admitting: Emergency Medicine

## 2023-08-09 DIAGNOSIS — E119 Type 2 diabetes mellitus without complications: Secondary | ICD-10-CM | POA: Insufficient documentation

## 2023-08-09 DIAGNOSIS — R079 Chest pain, unspecified: Secondary | ICD-10-CM

## 2023-08-09 DIAGNOSIS — K219 Gastro-esophageal reflux disease without esophagitis: Secondary | ICD-10-CM | POA: Diagnosis not present

## 2023-08-09 DIAGNOSIS — R0789 Other chest pain: Secondary | ICD-10-CM | POA: Diagnosis not present

## 2023-08-09 LAB — RESP PANEL BY RT-PCR (RSV, FLU A&B, COVID)  RVPGX2
Influenza A by PCR: NEGATIVE
Influenza B by PCR: NEGATIVE
Resp Syncytial Virus by PCR: NEGATIVE
SARS Coronavirus 2 by RT PCR: NEGATIVE

## 2023-08-09 LAB — CBC WITH DIFFERENTIAL/PLATELET
Abs Immature Granulocytes: 0.02 10*3/uL (ref 0.00–0.07)
Basophils Absolute: 0.1 10*3/uL (ref 0.0–0.1)
Basophils Relative: 1 %
Eosinophils Absolute: 0.1 10*3/uL (ref 0.0–0.5)
Eosinophils Relative: 2 %
HCT: 49.9 % — ABNORMAL HIGH (ref 36.0–46.0)
Hemoglobin: 15.1 g/dL — ABNORMAL HIGH (ref 12.0–15.0)
Immature Granulocytes: 0 %
Lymphocytes Relative: 48 %
Lymphs Abs: 2.8 10*3/uL (ref 0.7–4.0)
MCH: 28.4 pg (ref 26.0–34.0)
MCHC: 30.3 g/dL (ref 30.0–36.0)
MCV: 93.8 fL (ref 80.0–100.0)
Monocytes Absolute: 0.4 10*3/uL (ref 0.1–1.0)
Monocytes Relative: 7 %
Neutro Abs: 2.4 10*3/uL (ref 1.7–7.7)
Neutrophils Relative %: 42 %
Platelets: 224 10*3/uL (ref 150–400)
RBC: 5.32 MIL/uL — ABNORMAL HIGH (ref 3.87–5.11)
RDW: 13.4 % (ref 11.5–15.5)
WBC: 5.8 10*3/uL (ref 4.0–10.5)
nRBC: 0 % (ref 0.0–0.2)

## 2023-08-09 LAB — BASIC METABOLIC PANEL WITH GFR
Anion gap: 8 (ref 5–15)
BUN: 13 mg/dL (ref 6–20)
CO2: 24 mmol/L (ref 22–32)
Calcium: 8.9 mg/dL (ref 8.9–10.3)
Chloride: 107 mmol/L (ref 98–111)
Creatinine, Ser: 0.71 mg/dL (ref 0.44–1.00)
GFR, Estimated: >60 mL/min (ref >60)
Glucose, Bld: 126 mg/dL — ABNORMAL HIGH (ref 70–99)
Potassium: 3.6 mmol/L (ref 3.5–5.1)
Sodium: 139 mmol/L (ref 135–145)

## 2023-08-09 LAB — LIPASE, BLOOD: Lipase: 38 U/L (ref 11–51)

## 2023-08-09 LAB — TROPONIN I (HIGH SENSITIVITY): Troponin I (High Sensitivity): <2 ng/L (ref <18)

## 2023-08-09 MED ORDER — CYCLOBENZAPRINE HCL 5 MG PO TABS: 5.0000 mg | ORAL_TABLET | Freq: Three times a day (TID) | ORAL | 0 refills | Status: AC | PRN

## 2023-08-09 MED ORDER — FAMOTIDINE 20 MG PO TABS
40.0000 mg | ORAL_TABLET | Freq: Once | ORAL | Status: AC
  Administered 2023-08-09: 40 mg via ORAL
  Filled 2023-08-09: qty 2

## 2023-08-09 MED ORDER — PANTOPRAZOLE SODIUM 40 MG PO TBEC: 40.0000 mg | DELAYED_RELEASE_TABLET | Freq: Every day | ORAL | 5 refills | Status: AC

## 2023-08-09 NOTE — ED Triage Notes (Signed)
 Pt comes with cp that radiates to back that started last night. Pt stats it was constant last night and eased off. Pt states today it came back about hour ago. Pt states no sob.

## 2023-08-09 NOTE — ED Provider Notes (Signed)
 [facility] Emergency Department Provider Note     Event Date/Time   First MD Initiated Contact with Patient 08/09/23 2042     (approximate)   History   Chest Pain  HPI  Lauren Caldwell is a 53 y.o. female with a history of diabetes, HLD, OSA, and migraines who presents to the ED for onset of chest pain that began last night.  Patient would endorse constant symptoms last night but it eventually eased off.  She presents today after recurrence of her chest pain that required occurred about an hour prior to arrival.  She denies any cough, shortness of breath, nausea, vomiting, or diaphoresis.  Physical Exam   Triage Vital Signs: ED Triage Vitals  Encounter Vitals Group     BP 08/09/23 1848 128/88     Systolic BP Percentile --      Diastolic BP Percentile --      Pulse Rate 08/09/23 1848 79     Resp 08/09/23 1848 18     Temp 08/09/23 1848 98.4 F (36.9 C)     Temp src --      SpO2 08/09/23 1848 97 %     Weight 08/09/23 1847 217 lb (98.4 kg)     Height 08/09/23 1847 5\' 11"  (1.803 m)     Head Circumference --      Peak Flow --      Pain Score 08/09/23 1847 7     Pain Loc --      Pain Education --      Exclude from Growth Chart --     Most recent vital signs: Vitals:   08/09/23 2115 08/09/23 2200  BP: 116/86 127/88  Pulse: 69 73  Resp: 17 16  Temp:  98 F (36.7 C)  SpO2: 100% 100%    General Awake, no distress. NAD HEENT NCAT. PERRL. EOMI. No rhinorrhea. Mucous membranes are moist.  CV:  Good peripheral perfusion. RRR RESP:  Normal effort. CTA ABD:  No distention.  Nontender.  No rebound, guarding, or rigidity noted. MSK:  Normal spinal alignment without midline tenderness, spasm, deformity, or step-off.  Mild tenderness to palpation over the left pectoral muscle region.  Reproducible on exam.  Active range of motion of all extremities.   ED Results / Procedures / Treatments   Labs (all labs ordered are listed, but only abnormal  results are displayed) Labs Reviewed  CBC WITH DIFFERENTIAL/PLATELET - Abnormal; Notable for the following components:      Result Value   RBC 5.32 (*)    Hemoglobin 15.1 (*)    HCT 49.9 (*)    All other components within normal limits  BASIC METABOLIC PANEL WITH GFR - Abnormal; Notable for the following components:   Glucose, Bld 126 (*)    All other components within normal limits  RESP PANEL BY RT-PCR (RSV, FLU A&B, COVID)  RVPGX2  LIPASE, BLOOD  TROPONIN I (HIGH SENSITIVITY)    EKG  Vent. rate 82 BPM PR interval 164 ms QRS duration 66 ms QT/QTcB 362/422 ms P-R-T axes 78 73 69 Normal sinus rhythm Normal ECG When compared with ECG of 22-Nov-2014 04:47, No significant change was found No STEMI  RADIOLOGY  I personally viewed and evaluated these images as part of my medical decision making, as well as reviewing the written report by the radiologist.  ED Provider Interpretation: No acute findings  DG Chest 2 View Result Date: 08/09/2023 CLINICAL DATA:  Chest pain that radiates to  the back. EXAM: CHEST - 2 VIEW COMPARISON:  May 15, 2021 FINDINGS: The heart size and mediastinal contours are within normal limits. Both lungs are clear. The visualized skeletal structures are unremarkable. IMPRESSION: No active cardiopulmonary disease. Electronically Signed   By: Aram Candela M.D.   On: 08/09/2023 19:42     PROCEDURES:  Critical Care performed: No  Procedures   MEDICATIONS ORDERED IN ED: Medications  famotidine (PEPCID) tablet 40 mg (40 mg Oral Given 08/09/23 2227)     IMPRESSION / MDM / ASSESSMENT AND PLAN / ED COURSE  I reviewed the triage vital signs and the nursing notes.                              Differential diagnosis includes, but is not limited to,  ACS, aortic dissection, pulmonary embolism, cardiac tamponade, pneumothorax, pneumonia, pericarditis, myocarditis, GI-related causes including esophagitis/gastritis, and musculoskeletal chest wall pain.      Patient's presentation is most consistent with acute presentation with potential threat to life or bodily function.  Patient's diagnosis is consistent with nonspecific chest pain, but would likely of a musculoskeletal etiology versus a gastrointestinal etiology.  Patient is endorsing resolution of her prior symptoms on interval evaluation.  No ongoing chest discomfort or pressure is reported.  Reassuring cardiac workup at this time with a negative troponin with low concern for ACS.  EKG without evidence of malignant arrhythmia.  Labs are also negative without evidence of acute leukocytosis or critical anemia.  Chest x-ray reviewed by me does not reveal any acute intrathoracic process.  Patient will be discharged home with prescriptions for Flexeril and pantoprazole. Patient is to follow up with her primary provider as discussed, as needed or otherwise directed. Patient is given ED precautions to return to the ED for any worsening or new symptoms.  FINAL CLINICAL IMPRESSION(S) / ED DIAGNOSES   Final diagnoses:  Nonspecific chest pain  Gastroesophageal reflux disease without esophagitis     Rx / DC Orders   ED Discharge Orders          Ordered    pantoprazole (PROTONIX) 40 MG tablet  Daily        08/09/23 2259    cyclobenzaprine (FLEXERIL) 5 MG tablet  3 times daily PRN        08/09/23 2259             Note:  This document was prepared using Dragon voice recognition software and may include unintentional dictation errors.    Lissa Hoard, PA-C 08/09/23 2319    Merwyn Katos, MD 08/10/23 [phone]

## 2023-08-09 NOTE — Discharge Instructions (Addendum)
 Your exam, labs, EKG, and chest x-ray are normal and reassuring at this time.  No signs of a cardiac cause for your chest pain.  You also appear to have some significant reflux, and will be treated with an proton pump inhibitor.  Take the muscle relaxant as needed for musculoskeletal chest wall pain.  Follow-up with your primary provider or return to ED as needed for recurrent symptoms.

## 2023-08-09 NOTE — ED Provider Triage Note (Signed)
 Emergency Medicine Provider Triage Evaluation Note  Lauren Caldwell , a 53 y.o. female  was evaluated in triage.  Pt complains of chest pain that began last night. Pain in the center of the chest pain, radiates to the back. Pain was constant last night and eased off earlier today, pain has returned in severity about an hour ago.  Review of Systems  Positive: CP, increased fatigue Negative: SOB, Nausea  Physical Exam  LMP 11/21/2013 Comment: NCP Gen:   Awake, no distress   Resp:  Normal effort  MSK:   Moves extremities without difficulty  Other:    Medical Decision Making  Medically screening exam initiated at 6:44 PM.  Appropriate orders placed.  Lauren Caldwell was informed that the remainder of the evaluation will be completed by another provider, this initial triage assessment does not replace that evaluation, and the importance of remaining in the ED until their evaluation is complete.     Lauren Ali, PA-C 08/09/23 [phone]

## 2023-08-19 DIAGNOSIS — E559 Vitamin D deficiency, unspecified: Secondary | ICD-10-CM | POA: Diagnosis not present

## 2023-08-19 DIAGNOSIS — E7800 Pure hypercholesterolemia, unspecified: Secondary | ICD-10-CM | POA: Diagnosis not present

## 2023-08-19 DIAGNOSIS — E1169 Type 2 diabetes mellitus with other specified complication: Secondary | ICD-10-CM | POA: Diagnosis not present

## 2023-08-19 DIAGNOSIS — R809 Proteinuria, unspecified: Secondary | ICD-10-CM | POA: Diagnosis not present

## 2023-08-25 ENCOUNTER — Ambulatory Visit

## 2023-10-08 DIAGNOSIS — L259 Unspecified contact dermatitis, unspecified cause: Secondary | ICD-10-CM | POA: Diagnosis not present

## 2023-11-17 DIAGNOSIS — J029 Acute pharyngitis, unspecified: Secondary | ICD-10-CM | POA: Diagnosis not present

## 2023-11-17 DIAGNOSIS — R0602 Shortness of breath: Secondary | ICD-10-CM | POA: Diagnosis not present

## 2023-11-17 DIAGNOSIS — R051 Acute cough: Secondary | ICD-10-CM | POA: Diagnosis not present

## 2023-11-17 DIAGNOSIS — R0981 Nasal congestion: Secondary | ICD-10-CM | POA: Diagnosis not present

## 2023-11-17 DIAGNOSIS — H1033 Unspecified acute conjunctivitis, bilateral: Secondary | ICD-10-CM | POA: Diagnosis not present

## 2023-11-19 DIAGNOSIS — H109 Unspecified conjunctivitis: Secondary | ICD-10-CM | POA: Diagnosis not present

## 2023-11-19 DIAGNOSIS — J019 Acute sinusitis, unspecified: Secondary | ICD-10-CM | POA: Diagnosis not present

## 2023-12-01 ENCOUNTER — Ambulatory Visit
Admission: RE | Admit: 2023-12-01 | Discharge: 2023-12-01 | Disposition: A | Discharge status: Home / Self Care | Source: Ambulatory Visit | Attending: Physician Assistant | Admitting: Physician Assistant

## 2023-12-01 DIAGNOSIS — Z1231 Encounter for screening mammogram for malignant neoplasm of breast: Secondary | ICD-10-CM | POA: Diagnosis not present

## 2023-12-08 DIAGNOSIS — H6991 Unspecified Eustachian tube disorder, right ear: Secondary | ICD-10-CM | POA: Diagnosis not present

## 2023-12-08 DIAGNOSIS — J069 Acute upper respiratory infection, unspecified: Secondary | ICD-10-CM | POA: Diagnosis not present

## 2023-12-08 DIAGNOSIS — R0982 Postnasal drip: Secondary | ICD-10-CM | POA: Diagnosis not present

## 2024-01-07 DIAGNOSIS — M25511 Pain in right shoulder: Secondary | ICD-10-CM | POA: Diagnosis not present

## 2024-01-07 DIAGNOSIS — M25562 Pain in left knee: Secondary | ICD-10-CM | POA: Diagnosis not present

## 2024-01-19 DIAGNOSIS — M25562 Pain in left knee: Secondary | ICD-10-CM | POA: Diagnosis not present

## 2024-01-19 DIAGNOSIS — M2342 Loose body in knee, left knee: Secondary | ICD-10-CM | POA: Diagnosis not present

## 2024-02-02 DIAGNOSIS — M25562 Pain in left knee: Secondary | ICD-10-CM | POA: Diagnosis not present

## 2024-02-08 DIAGNOSIS — M2242 Chondromalacia patellae, left knee: Secondary | ICD-10-CM | POA: Diagnosis not present

## 2024-02-08 DIAGNOSIS — S83242A Other tear of medial meniscus, current injury, left knee, initial encounter: Secondary | ICD-10-CM | POA: Diagnosis not present

## 2024-02-16 DIAGNOSIS — M25562 Pain in left knee: Secondary | ICD-10-CM | POA: Diagnosis not present

## 2024-02-16 DIAGNOSIS — M222X2 Patellofemoral disorders, left knee: Secondary | ICD-10-CM | POA: Diagnosis not present

## 2024-02-21 DIAGNOSIS — M25562 Pain in left knee: Secondary | ICD-10-CM | POA: Diagnosis not present

## 2024-02-21 DIAGNOSIS — M222X2 Patellofemoral disorders, left knee: Secondary | ICD-10-CM | POA: Diagnosis not present

## 2024-03-01 DIAGNOSIS — J309 Allergic rhinitis, unspecified: Secondary | ICD-10-CM | POA: Diagnosis not present

## 2024-03-01 DIAGNOSIS — Z0000 Encounter for general adult medical examination without abnormal findings: Secondary | ICD-10-CM | POA: Diagnosis not present

## 2024-03-01 DIAGNOSIS — R809 Proteinuria, unspecified: Secondary | ICD-10-CM | POA: Diagnosis not present

## 2024-03-01 DIAGNOSIS — Z23 Encounter for immunization: Secondary | ICD-10-CM | POA: Diagnosis not present

## 2024-03-01 DIAGNOSIS — E1169 Type 2 diabetes mellitus with other specified complication: Secondary | ICD-10-CM | POA: Diagnosis not present

## 2024-03-01 DIAGNOSIS — K5904 Chronic idiopathic constipation: Secondary | ICD-10-CM | POA: Diagnosis not present

## 2024-03-01 DIAGNOSIS — E7800 Pure hypercholesterolemia, unspecified: Secondary | ICD-10-CM | POA: Diagnosis not present

## 2024-03-02 DIAGNOSIS — M25562 Pain in left knee: Secondary | ICD-10-CM | POA: Diagnosis not present

## 2024-03-02 DIAGNOSIS — M222X2 Patellofemoral disorders, left knee: Secondary | ICD-10-CM | POA: Diagnosis not present

## 2024-03-15 DIAGNOSIS — M222X2 Patellofemoral disorders, left knee: Secondary | ICD-10-CM | POA: Diagnosis not present

## 2024-03-15 DIAGNOSIS — M25562 Pain in left knee: Secondary | ICD-10-CM | POA: Diagnosis not present

## 2024-03-21 DIAGNOSIS — M222X2 Patellofemoral disorders, left knee: Secondary | ICD-10-CM | POA: Diagnosis not present

## 2024-03-21 DIAGNOSIS — M25562 Pain in left knee: Secondary | ICD-10-CM | POA: Diagnosis not present

## 2024-03-29 DIAGNOSIS — Z01419 Encounter for gynecological examination (general) (routine) without abnormal findings: Secondary | ICD-10-CM | POA: Diagnosis not present

## 2024-03-29 DIAGNOSIS — Z124 Encounter for screening for malignant neoplasm of cervix: Secondary | ICD-10-CM | POA: Diagnosis not present

## 2024-03-29 DIAGNOSIS — Z1151 Encounter for screening for human papillomavirus (HPV): Secondary | ICD-10-CM | POA: Diagnosis not present

## 2024-04-10 DIAGNOSIS — S83242D Other tear of medial meniscus, current injury, left knee, subsequent encounter: Secondary | ICD-10-CM | POA: Diagnosis not present

## 2024-04-10 DIAGNOSIS — M1712 Unilateral primary osteoarthritis, left knee: Secondary | ICD-10-CM | POA: Diagnosis not present

## 2024-04-13 DIAGNOSIS — M25562 Pain in left knee: Secondary | ICD-10-CM | POA: Diagnosis not present

## 2024-04-13 DIAGNOSIS — M222X2 Patellofemoral disorders, left knee: Secondary | ICD-10-CM | POA: Diagnosis not present
